# Patient Record
Sex: Female | Born: 1965
Health system: Southern US, Community
[De-identification: ages and names within clinical notes are randomized; demographics above are authoritative.]

## PROBLEM LIST (undated history)

## (undated) DIAGNOSIS — I10 Essential (primary) hypertension: Secondary | ICD-10-CM

## (undated) DIAGNOSIS — F419 Anxiety disorder, unspecified: Secondary | ICD-10-CM

## (undated) DIAGNOSIS — E7800 Pure hypercholesterolemia, unspecified: Secondary | ICD-10-CM

## (undated) DIAGNOSIS — N870 Mild cervical dysplasia: Secondary | ICD-10-CM

## (undated) DIAGNOSIS — T8859XA Other complications of anesthesia, initial encounter: Secondary | ICD-10-CM

## (undated) HISTORY — DX: Pure hypercholesterolemia, unspecified: E78.00

## (undated) HISTORY — PX: BACK SURGERY: SHX140

## (undated) HISTORY — PX: NECK SURGERY: SHX720

## (undated) HISTORY — PX: APPENDECTOMY: SHX54

## (undated) HISTORY — DX: Mild cervical dysplasia: N87.0

## (undated) HISTORY — DX: Essential (primary) hypertension: I10

---

## 1990-05-28 HISTORY — PX: COLPOSCOPY: SHX161

## 1999-03-22 ENCOUNTER — Encounter: Payer: Self-pay | Admitting: Obstetrics and Gynecology

## 1999-03-22 ENCOUNTER — Encounter: Admission: RE | Admit: 1999-03-22 | Discharge: 1999-03-22 | Payer: Self-pay | Admitting: Obstetrics and Gynecology

## 1999-06-08 ENCOUNTER — Other Ambulatory Visit: Admission: RE | Admit: 1999-06-08 | Discharge: 1999-06-08 | Payer: Self-pay | Admitting: Family Medicine

## 2000-01-01 ENCOUNTER — Ambulatory Visit (HOSPITAL_COMMUNITY): Admission: RE | Admit: 2000-01-01 | Discharge: 2000-01-01 | Payer: Self-pay | Admitting: Family Medicine

## 2000-01-01 ENCOUNTER — Encounter: Payer: Self-pay | Admitting: Family Medicine

## 2000-02-23 ENCOUNTER — Encounter: Payer: Self-pay | Admitting: Neurosurgery

## 2000-02-27 ENCOUNTER — Encounter: Payer: Self-pay | Admitting: Neurosurgery

## 2000-02-27 ENCOUNTER — Ambulatory Visit (HOSPITAL_COMMUNITY): Admission: RE | Admit: 2000-02-27 | Discharge: 2000-02-27 | Payer: Self-pay | Admitting: Neurosurgery

## 2000-08-20 ENCOUNTER — Encounter: Payer: Self-pay | Admitting: Family Medicine

## 2000-08-20 ENCOUNTER — Encounter: Admission: RE | Admit: 2000-08-20 | Discharge: 2000-08-20 | Payer: Self-pay | Admitting: Family Medicine

## 2003-01-27 ENCOUNTER — Encounter: Payer: Self-pay | Admitting: Family Medicine

## 2003-01-27 ENCOUNTER — Encounter: Admission: RE | Admit: 2003-01-27 | Discharge: 2003-01-27 | Payer: Self-pay | Admitting: Family Medicine

## 2003-03-23 ENCOUNTER — Encounter: Admission: RE | Admit: 2003-03-23 | Discharge: 2003-03-23 | Payer: Self-pay | Admitting: Family Medicine

## 2003-09-09 ENCOUNTER — Other Ambulatory Visit: Admission: RE | Admit: 2003-09-09 | Discharge: 2003-09-09 | Payer: Self-pay | Admitting: Family Medicine

## 2004-04-27 ENCOUNTER — Encounter: Admission: RE | Admit: 2004-04-27 | Discharge: 2004-04-27 | Payer: Self-pay | Admitting: Obstetrics and Gynecology

## 2004-09-25 ENCOUNTER — Other Ambulatory Visit: Admission: RE | Admit: 2004-09-25 | Discharge: 2004-09-25 | Payer: Self-pay | Admitting: Family Medicine

## 2004-09-26 ENCOUNTER — Encounter: Admission: RE | Admit: 2004-09-26 | Discharge: 2004-12-25 | Payer: Self-pay | Admitting: Family Medicine

## 2005-07-13 ENCOUNTER — Ambulatory Visit: Payer: Self-pay | Admitting: *Deleted

## 2005-08-21 ENCOUNTER — Encounter: Admission: RE | Admit: 2005-08-21 | Discharge: 2005-08-21 | Payer: Self-pay | Admitting: Family Medicine

## 2005-09-11 ENCOUNTER — Encounter: Admission: RE | Admit: 2005-09-11 | Discharge: 2005-09-11 | Payer: Self-pay | Admitting: Family Medicine

## 2005-10-01 ENCOUNTER — Other Ambulatory Visit: Admission: RE | Admit: 2005-10-01 | Discharge: 2005-10-01 | Payer: Self-pay | Admitting: Family Medicine

## 2006-09-13 ENCOUNTER — Encounter: Admission: RE | Admit: 2006-09-13 | Discharge: 2006-09-13 | Payer: Self-pay | Admitting: Family Medicine

## 2006-10-03 ENCOUNTER — Other Ambulatory Visit: Admission: RE | Admit: 2006-10-03 | Discharge: 2006-10-03 | Payer: Self-pay | Admitting: Family Medicine

## 2007-09-16 ENCOUNTER — Encounter: Admission: RE | Admit: 2007-09-16 | Discharge: 2007-09-16 | Payer: Self-pay | Admitting: Gynecology

## 2007-10-24 ENCOUNTER — Other Ambulatory Visit: Admission: RE | Admit: 2007-10-24 | Discharge: 2007-10-24 | Payer: Self-pay | Admitting: Gynecology

## 2008-01-29 ENCOUNTER — Ambulatory Visit: Payer: Self-pay | Admitting: Gynecology

## 2008-05-05 ENCOUNTER — Ambulatory Visit: Payer: Self-pay | Admitting: Gynecology

## 2008-09-15 ENCOUNTER — Ambulatory Visit: Payer: Self-pay | Admitting: Gynecology

## 2008-10-13 ENCOUNTER — Encounter: Admission: RE | Admit: 2008-10-13 | Discharge: 2008-10-13 | Payer: Self-pay | Admitting: Gynecology

## 2008-10-27 ENCOUNTER — Other Ambulatory Visit: Admission: RE | Admit: 2008-10-27 | Discharge: 2008-10-27 | Payer: Self-pay | Admitting: Gynecology

## 2008-10-27 ENCOUNTER — Encounter: Payer: Self-pay | Admitting: Gynecology

## 2008-10-27 ENCOUNTER — Ambulatory Visit: Payer: Self-pay | Admitting: Gynecology

## 2009-11-22 ENCOUNTER — Other Ambulatory Visit: Admission: RE | Admit: 2009-11-22 | Discharge: 2009-11-22 | Payer: Self-pay | Admitting: Gynecology

## 2009-11-22 ENCOUNTER — Ambulatory Visit (HOSPITAL_COMMUNITY): Admission: RE | Admit: 2009-11-22 | Discharge: 2009-11-22 | Payer: Self-pay | Admitting: Gynecology

## 2009-11-22 ENCOUNTER — Ambulatory Visit: Payer: Self-pay | Admitting: Gynecology

## 2010-06-17 ENCOUNTER — Encounter: Payer: Self-pay | Admitting: Obstetrics and Gynecology

## 2010-06-17 ENCOUNTER — Encounter: Payer: Self-pay | Admitting: Interventional Radiology

## 2010-10-13 NOTE — Op Note (Signed)
[facility] [place]  Patient:    Terri Campbell, Terri Campbell                    MRN: [id number] Proc. Date: 02/27/00 Adm. Date:  [id number] Disc. Date: [id number] Attending:  Donn Pierini                           Operative Report  PREOPERATIVE DIAGNOSIS:  Left C6-7 herniated nucleus pulposus with radiculopathy.  POSTOPERATIVE DIAGNOSIS:  Left C6-7 herniated nucleus pulposus with radiculopathy.  OPERATION PERFORMED:  Left C6-7 laminotomy and foraminotomy with microdiskectomy.  SURGEON:  Julio Sicks, M.D.  ASSISTANT:  Reinaldo Meeker, M.D.  ANESTHESIA:  General endotracheal.  INDICATIONS:  [name] is a 45 year old female with history of neck and left upper extremity pain, paresthesias and weakness with a left-sided C7 radiculopathy, which has failed after unsuccessful conservative management. Her MRI scan demonstrated a large left-sided C6-7 foraminal disk herniation causing compression on the left-sided C7 nerve root.  The patient has been counselled as to her options.  She has decided to proceed with a left-sided C6-7 foraminotomy and microdiskectomy for hope of relief of her symptoms.  DESCRIPTION OF PROCEDURE:  The patient was brought to the operating room and placed on the table in the supine position.  After an adequate level of anesthesia was achieved, the patient was positioned prone onto bolsters with her head fixed in a somewhat flexed head position, and held in place with a Mayfield pin headrest. The patients posterior cervical region was prepped and draped sterilely.  A #10 blade was used to make a linear skin incision upon the C6-7 interspace.  A subperiosteal dissection was then performed on the left side exposing the lamina and facet joints of C6 and C7. Intraoperative x-ray was taken.  The level was confirmed.  A laminotomy and foraminotomy were then performed using the high-speed drill and Kerrison rongeurs to remove the inferior  edge of the lamina of C6 and the superior rim of the lamina of C7 as well as the ligamentum flavum between the two.  The facets were undercut.  The C7 nerve root was identified and followed out distal into its foramen.  The microscope was brought into the field and used for microdissection of the left-sided C7 nerve root and underlying disk herniation.  The epineural venous plexus was coagulated and cut.  Micro nerve hooks were then passed beneath the C7 nerve root.  A large amount of free disk herniation was dissected free completely decompressing the nerve root.  All disk material was removed using pituitary microrongeurs.  At this point a very thorough decompression of the left-sided C7 nerve root had been achieved. There was no residual disk herniation.  A blunt probe was passed easily both medially and along the course of the nerve root itself.  The wound was then copiously irrigated with antibiotic solution.  Gelfoam was placed topically for hemostasis and was shown to be good.  Microscope and the retractors were removed.  Hemostasis in the muscle was achieved with electrocautery.  The wound was then closed in layers with Vicryl sutures. Staples were applied to the surface.  There were no apparent complications.  The patient tolerated the procedure well and she returns to the recovery room postoperatively. DD:  02/27/00 TD:  02/27/00 Job: [id number]

## 2010-12-07 ENCOUNTER — Other Ambulatory Visit: Payer: Self-pay | Admitting: Gynecology

## 2010-12-07 DIAGNOSIS — Z1231 Encounter for screening mammogram for malignant neoplasm of breast: Secondary | ICD-10-CM

## 2010-12-22 ENCOUNTER — Other Ambulatory Visit (HOSPITAL_COMMUNITY)
Admission: RE | Admit: 2010-12-22 | Discharge: 2010-12-22 | Disposition: A | Discharge status: Home / Self Care | Payer: Managed Care, Other (non HMO) | Source: Ambulatory Visit | Attending: Gynecology | Admitting: Gynecology

## 2010-12-22 ENCOUNTER — Ambulatory Visit (INDEPENDENT_AMBULATORY_CARE_PROVIDER_SITE_OTHER): Payer: Managed Care, Other (non HMO) | Admitting: Gynecology

## 2010-12-22 ENCOUNTER — Encounter: Payer: Self-pay | Admitting: Gynecology

## 2010-12-22 ENCOUNTER — Ambulatory Visit (HOSPITAL_COMMUNITY)
Admission: RE | Admit: 2010-12-22 | Discharge: 2010-12-22 | Disposition: A | Discharge status: Home / Self Care | Payer: Managed Care, Other (non HMO) | Source: Ambulatory Visit | Attending: Gynecology | Admitting: Gynecology

## 2010-12-22 VITALS — BP 120/74 | Ht 65.5 in | Wt 228.0 lb

## 2010-12-22 DIAGNOSIS — R82998 Other abnormal findings in urine: Secondary | ICD-10-CM

## 2010-12-22 DIAGNOSIS — Z30431 Encounter for routine checking of intrauterine contraceptive device: Secondary | ICD-10-CM

## 2010-12-22 DIAGNOSIS — Z113 Encounter for screening for infections with a predominantly sexual mode of transmission: Secondary | ICD-10-CM

## 2010-12-22 DIAGNOSIS — B373 Candidiasis of vulva and vagina: Secondary | ICD-10-CM

## 2010-12-22 DIAGNOSIS — Z1231 Encounter for screening mammogram for malignant neoplasm of breast: Secondary | ICD-10-CM | POA: Insufficient documentation

## 2010-12-22 DIAGNOSIS — Z01419 Encounter for gynecological examination (general) (routine) without abnormal findings: Secondary | ICD-10-CM

## 2010-12-22 MED ORDER — FLUCONAZOLE 200 MG PO TABS: 200.0000 mg | ORAL_TABLET | Freq: Every day | ORAL | Status: AC

## 2010-12-22 NOTE — Progress Notes (Signed)
Terri Campbell 12-26-1965 [id number]    History:    45 y.o.  for annual exam overall doing well.  Patient does note some spotting after intercourse and wonders whether she has a vaginal infection as she does not normally bleed with the Mirena IUD unless she has an infection. The IUD was placed September 09. She recently saw Dr. Hyacinth Meeker for her full general health exam and followup on her cholesterol and she had done an HIV RPR lipid panel and compensated metabolic panel. She wants also to get STD screening GC and Chlamydia and wants to complete panel serum with hepatitis B and hepatitis C.   Past medical history, past surgical history, family history and social history were all reviewed and documented in the EPIC chart.   ROS:  A 14 point ROS was performed and pertinent positives and negatives are included in the history.  Exam:  Filed Vitals:   12/22/10 1536  BP: 120/74    General appearance:  Normal Head/Neck:  Normal, without cervical or supraclavicular adenopathy. Thyroid:  Symmetrical, normal in size, without palpable masses or nodularity. Respiratory  Effort:  Normal  Auscultation:  Clear without wheezing or rhonchi Cardiovascular  Auscultation:  Regular rate, without rubs, murmurs or gallops  Edema/varicosities:  Not grossly evident Abdominal  Masses/tenderness:  Soft,nontender, without masses, guarding or rebound.  Liver/spleen:  No organomegaly noted  Hernia:  None appreciated    Skin  Inspection:  Grossly normal  Palpation:  Grossly normal Neurologic/psychiatric  Orientation:  Normal with appropriate conversation.  Mood/affect:  Normal  Genitourinary with chaperone present    Breasts: Examined lying and sitting.     Right: Without masses, retractions,discharge or axillary adenopathy.     Left: Without masses, retractions, discharge or axillary adenopathy.   Inguinal/mons:  Normal without inguinal adenopathy  External genitalia:  Normal  BUS/Urethra/Skene's  glands:  Normal   Bladder:  Normal   Vagina:  Normal white discharge noted KOH wet prep done   Cervix:  Normal IUD string visualized GC chlamydia screen done  Uterus:  anteverted, normal in size, shape and contour.  Midline and mobile nontender   Adnexa/parametria:     Rt: Without masses or tenderness    Lt: Without masses or tenderness  Anus and perineum: Normal   Digital rectal exam: Normal sphincter tone without palpated masses or tenderness.   Assessment/Plan:  45 y.o. female for annual exam doing well. Her wet prep is positive for yeast and we will cover with Diflucan 200 mg daily for 5 days and she is prone to yeast infections and hopefully we can eradicate any colonization.  Assuming she does well and does no further postcoital spotting then we'll follow, if symptoms persist or recur then she'll return for further evaluation.  Doing well with her IUD due to be removed or exchanged in September 2014. Self breast exams on a monthly basis discussed and encouraged, she had her mammogram today, assuming normal we'll continue with annual mammography. I ordered a CBC hep B. Hep C. urinalysis GC and Chlamydia screen of the cervix and a urinalysis.  She will followup for the results and assuming normal and she continues well and she will see [place] in a year sooner as needed.   Dara Lords MD, 4:43 PM 12/22/2010

## 2010-12-23 LAB — HEPATITIS C ANTIBODY: HCV Ab: NEGATIVE

## 2010-12-23 LAB — HEPATITIS B SURFACE ANTIGEN: Hepatitis B Surface Ag: NEGATIVE

## 2010-12-25 ENCOUNTER — Telehealth: Payer: Self-pay | Admitting: Gynecology

## 2010-12-25 DIAGNOSIS — N390 Urinary tract infection, site not specified: Secondary | ICD-10-CM

## 2010-12-25 MED ORDER — SULFAMETHOXAZOLE-TRIMETHOPRIM 800-160 MG PO TABS: 1.0000 | ORAL_TABLET | Freq: Two times a day (BID) | ORAL | Status: AC

## 2010-12-25 NOTE — Telephone Encounter (Signed)
Tell patient urinalysis has a small amount of bacteria in it and I want to treat her with Septra DS one by mouth twice a day x3 days.

## 2010-12-25 NOTE — Telephone Encounter (Signed)
PT INFORMED TO TAKE RX AS DIRECTED BELOW.

## 2010-12-25 NOTE — Telephone Encounter (Signed)
LM FOR PT TO CALL ZO:XWRUE MESSAGE.

## 2010-12-27 ENCOUNTER — Other Ambulatory Visit: Payer: Self-pay | Admitting: Gynecology

## 2010-12-27 DIAGNOSIS — R928 Other abnormal and inconclusive findings on diagnostic imaging of breast: Secondary | ICD-10-CM

## 2010-12-28 NOTE — Progress Notes (Signed)
APPOINTMENTS FOR ADDITIONAL VIEWS ON 01/08/11.

## 2011-01-01 ENCOUNTER — Other Ambulatory Visit: Payer: Self-pay | Admitting: *Deleted

## 2011-01-01 DIAGNOSIS — N63 Unspecified lump in unspecified breast: Secondary | ICD-10-CM

## 2011-01-05 ENCOUNTER — Other Ambulatory Visit: Payer: Managed Care, Other (non HMO)

## 2011-01-08 ENCOUNTER — Ambulatory Visit
Admission: RE | Admit: 2011-01-08 | Discharge: 2011-01-08 | Disposition: A | Discharge status: Home / Self Care | Payer: Managed Care, Other (non HMO) | Source: Ambulatory Visit | Attending: Gynecology | Admitting: Gynecology

## 2011-01-08 DIAGNOSIS — R928 Other abnormal and inconclusive findings on diagnostic imaging of breast: Secondary | ICD-10-CM

## 2011-11-23 ENCOUNTER — Other Ambulatory Visit (HOSPITAL_COMMUNITY)
Admission: RE | Admit: 2011-11-23 | Discharge: 2011-11-23 | Disposition: A | Discharge status: Home / Self Care | Payer: 59 | Source: Ambulatory Visit | Attending: Family Medicine | Admitting: Family Medicine

## 2011-11-23 ENCOUNTER — Other Ambulatory Visit: Payer: Self-pay | Admitting: Family Medicine

## 2011-11-23 DIAGNOSIS — Z113 Encounter for screening for infections with a predominantly sexual mode of transmission: Secondary | ICD-10-CM | POA: Insufficient documentation

## 2011-11-23 DIAGNOSIS — Z124 Encounter for screening for malignant neoplasm of cervix: Secondary | ICD-10-CM | POA: Insufficient documentation

## 2011-11-23 DIAGNOSIS — Z1159 Encounter for screening for other viral diseases: Secondary | ICD-10-CM | POA: Insufficient documentation

## 2011-12-24 ENCOUNTER — Encounter: Payer: Managed Care, Other (non HMO) | Admitting: Gynecology

## 2012-06-02 ENCOUNTER — Other Ambulatory Visit (HOSPITAL_COMMUNITY): Payer: Self-pay | Admitting: Family Medicine

## 2012-06-02 DIAGNOSIS — Z1231 Encounter for screening mammogram for malignant neoplasm of breast: Secondary | ICD-10-CM

## 2012-06-16 ENCOUNTER — Ambulatory Visit (HOSPITAL_COMMUNITY)
Admission: RE | Admit: 2012-06-16 | Discharge: 2012-06-16 | Disposition: A | Discharge status: Home / Self Care | Payer: BC Managed Care – PPO | Source: Ambulatory Visit | Attending: Family Medicine | Admitting: Family Medicine

## 2012-06-16 DIAGNOSIS — Z1231 Encounter for screening mammogram for malignant neoplasm of breast: Secondary | ICD-10-CM | POA: Insufficient documentation

## 2013-06-15 ENCOUNTER — Other Ambulatory Visit (HOSPITAL_COMMUNITY): Payer: Self-pay | Admitting: Family Medicine

## 2013-06-15 DIAGNOSIS — Z1231 Encounter for screening mammogram for malignant neoplasm of breast: Secondary | ICD-10-CM

## 2013-06-30 ENCOUNTER — Ambulatory Visit (HOSPITAL_COMMUNITY): Payer: Managed Care, Other (non HMO)

## 2013-06-30 ENCOUNTER — Ambulatory Visit (HOSPITAL_COMMUNITY)
Admission: RE | Admit: 2013-06-30 | Discharge: 2013-06-30 | Disposition: A | Discharge status: Home / Self Care | Payer: BC Managed Care – PPO | Source: Ambulatory Visit | Attending: Family Medicine | Admitting: Family Medicine

## 2013-06-30 DIAGNOSIS — Z1231 Encounter for screening mammogram for malignant neoplasm of breast: Secondary | ICD-10-CM | POA: Insufficient documentation

## 2013-11-11 ENCOUNTER — Ambulatory Visit (HOSPITAL_COMMUNITY)
Admission: EM | Admit: 2013-11-11 | Discharge: 2013-11-12 | Disposition: A | Discharge status: Home / Self Care | Payer: BC Managed Care – PPO | Attending: General Surgery | Admitting: General Surgery

## 2013-11-11 ENCOUNTER — Emergency Department (HOSPITAL_COMMUNITY): Payer: BC Managed Care – PPO

## 2013-11-11 ENCOUNTER — Encounter (HOSPITAL_COMMUNITY)
Admission: EM | Disposition: A | Discharge status: Home / Self Care | Payer: Self-pay | Source: Home / Self Care | Attending: Emergency Medicine

## 2013-11-11 ENCOUNTER — Ambulatory Visit: Admit: 2013-11-11 | Payer: Self-pay | Admitting: Surgery

## 2013-11-11 ENCOUNTER — Encounter (HOSPITAL_COMMUNITY): Payer: BC Managed Care – PPO | Admitting: Anesthesiology

## 2013-11-11 ENCOUNTER — Emergency Department (HOSPITAL_COMMUNITY): Payer: BC Managed Care – PPO | Admitting: Anesthesiology

## 2013-11-11 ENCOUNTER — Encounter (HOSPITAL_COMMUNITY): Payer: Self-pay | Admitting: Emergency Medicine

## 2013-11-11 DIAGNOSIS — I10 Essential (primary) hypertension: Secondary | ICD-10-CM | POA: Insufficient documentation

## 2013-11-11 DIAGNOSIS — Z9049 Acquired absence of other specified parts of digestive tract: Secondary | ICD-10-CM

## 2013-11-11 DIAGNOSIS — K3580 Unspecified acute appendicitis: Secondary | ICD-10-CM

## 2013-11-11 DIAGNOSIS — R1031 Right lower quadrant pain: Secondary | ICD-10-CM

## 2013-11-11 DIAGNOSIS — B373 Candidiasis of vulva and vagina: Secondary | ICD-10-CM | POA: Insufficient documentation

## 2013-11-11 DIAGNOSIS — K37 Unspecified appendicitis: Secondary | ICD-10-CM

## 2013-11-11 DIAGNOSIS — Z79899 Other long term (current) drug therapy: Secondary | ICD-10-CM | POA: Insufficient documentation

## 2013-11-11 DIAGNOSIS — B3731 Acute candidiasis of vulva and vagina: Secondary | ICD-10-CM | POA: Insufficient documentation

## 2013-11-11 HISTORY — PX: LAPAROSCOPIC APPENDECTOMY: SHX408

## 2013-11-11 LAB — CBC WITH DIFFERENTIAL/PLATELET
BASOS ABS: 0 10*3/uL (ref 0.0–0.1)
Basophils Relative: 0 % (ref 0–1)
Eosinophils Absolute: 0 10*3/uL (ref 0.0–0.7)
Eosinophils Relative: 0 % (ref 0–5)
HCT: 46.2 % — ABNORMAL HIGH (ref 36.0–46.0)
Hemoglobin: 16.2 g/dL — ABNORMAL HIGH (ref 12.0–15.0)
LYMPHS ABS: 1.7 10*3/uL (ref 0.7–4.0)
Lymphocytes Relative: 6 % — ABNORMAL LOW (ref 12–46)
MCH: 30.7 pg (ref 26.0–34.0)
MCHC: 35.1 g/dL (ref 30.0–36.0)
MCV: 87.7 fL (ref 78.0–100.0)
MONO ABS: 1.7 10*3/uL — AB (ref 0.1–1.0)
Monocytes Relative: 6 % (ref 3–12)
NEUTROS PCT: 88 % — AB (ref 43–77)
Neutro Abs: 25.6 10*3/uL — ABNORMAL HIGH (ref 1.7–7.7)
PLATELETS: 433 10*3/uL — AB (ref 150–400)
RBC: 5.27 MIL/uL — ABNORMAL HIGH (ref 3.87–5.11)
RDW: 13.6 % (ref 11.5–15.5)
WBC: 29 10*3/uL — ABNORMAL HIGH (ref 4.0–10.5)

## 2013-11-11 LAB — URINALYSIS, ROUTINE W REFLEX MICROSCOPIC
Bilirubin Urine: NEGATIVE
GLUCOSE, UA: NEGATIVE mg/dL
KETONES UR: NEGATIVE mg/dL
Nitrite: NEGATIVE
PROTEIN: 30 mg/dL — AB
Specific Gravity, Urine: 1.028 (ref 1.005–1.030)
Urobilinogen, UA: 0.2 mg/dL (ref 0.0–1.0)
pH: 5.5 (ref 5.0–8.0)

## 2013-11-11 LAB — COMPREHENSIVE METABOLIC PANEL
ALT: 21 U/L (ref 0–35)
AST: 20 U/L (ref 0–37)
Albumin: 4.2 g/dL (ref 3.5–5.2)
Alkaline Phosphatase: 100 U/L (ref 39–117)
BILIRUBIN TOTAL: 0.5 mg/dL (ref 0.3–1.2)
BUN: 18 mg/dL (ref 6–23)
CHLORIDE: 94 meq/L — AB (ref 96–112)
CO2: 24 meq/L (ref 19–32)
CREATININE: 0.93 mg/dL (ref 0.50–1.10)
Calcium: 10.5 mg/dL (ref 8.4–10.5)
GFR calc Af Amer: 82 mL/min — ABNORMAL LOW (ref >90)
GFR, EST NON AFRICAN AMERICAN: 71 mL/min — AB (ref >90)
GLUCOSE: 130 mg/dL — AB (ref 70–99)
Potassium: 4 mEq/L (ref 3.7–5.3)
Sodium: 136 mEq/L — ABNORMAL LOW (ref 137–147)
Total Protein: 9 g/dL — ABNORMAL HIGH (ref 6.0–8.3)

## 2013-11-11 LAB — URINE MICROSCOPIC-ADD ON

## 2013-11-11 LAB — POC URINE PREG, ED: Preg Test, Ur: NEGATIVE

## 2013-11-11 LAB — LIPASE, BLOOD: Lipase: 27 U/L (ref 11–59)

## 2013-11-11 SURGERY — APPENDECTOMY, LAPAROSCOPIC
Anesthesia: General | Site: Abdomen

## 2013-11-11 MED ORDER — LACTATED RINGERS IV SOLN: INTRAVENOUS | Status: DC

## 2013-11-11 MED ORDER — KCL IN DEXTROSE-NACL 20-5-0.45 MEQ/L-%-% IV SOLN
INTRAVENOUS | Status: DC
  Administered 2013-11-11 – 2013-11-12 (×2): via INTRAVENOUS
  Filled 2013-11-11 (×2): qty 1000

## 2013-11-11 MED ORDER — ONDANSETRON HCL 4 MG/2ML IJ SOLN
INTRAMUSCULAR | Status: DC | PRN
  Administered 2013-11-11: 4 mg via INTRAVENOUS

## 2013-11-11 MED ORDER — FENTANYL CITRATE 0.05 MG/ML IJ SOLN
INTRAMUSCULAR | Status: DC | PRN
  Administered 2013-11-11: 50 ug via INTRAVENOUS
  Administered 2013-11-11: 150 ug via INTRAVENOUS
  Administered 2013-11-11: 50 ug via INTRAVENOUS

## 2013-11-11 MED ORDER — HEPARIN SODIUM (PORCINE) 5000 UNIT/ML IJ SOLN
5000.0000 [IU] | Freq: Three times a day (TID) | INTRAMUSCULAR | Status: DC
  Administered 2013-11-12: 5000 [IU] via SUBCUTANEOUS
  Filled 2013-11-11 (×4): qty 1

## 2013-11-11 MED ORDER — ONDANSETRON HCL 4 MG/2ML IJ SOLN
INTRAMUSCULAR | Status: AC
Start: 2013-11-11 — End: 2013-11-11
  Filled 2013-11-11: qty 2

## 2013-11-11 MED ORDER — DEXAMETHASONE SODIUM PHOSPHATE 10 MG/ML IJ SOLN
INTRAMUSCULAR | Status: AC
  Filled 2013-11-11: qty 1

## 2013-11-11 MED ORDER — PROMETHAZINE HCL 25 MG/ML IJ SOLN
6.2500 mg | INTRAMUSCULAR | Status: DC | PRN
Start: 2013-11-11 — End: 2013-11-11

## 2013-11-11 MED ORDER — IOHEXOL 300 MG/ML  SOLN
100.0000 mL | Freq: Once | INTRAMUSCULAR | Status: AC | PRN
  Administered 2013-11-11: 100 mL via INTRAVENOUS

## 2013-11-11 MED ORDER — MIDAZOLAM HCL 5 MG/5ML IJ SOLN
INTRAMUSCULAR | Status: DC | PRN
  Administered 2013-11-11: 2 mg via INTRAVENOUS

## 2013-11-11 MED ORDER — SODIUM CHLORIDE 0.9 % IV SOLN
INTRAVENOUS | Status: DC | PRN
  Administered 2013-11-11 (×2): via INTRAVENOUS

## 2013-11-11 MED ORDER — FENTANYL CITRATE 0.05 MG/ML IJ SOLN: 25.0000 ug | INTRAMUSCULAR | Status: DC | PRN

## 2013-11-11 MED ORDER — PROPOFOL 10 MG/ML IV BOLUS
INTRAVENOUS | Status: DC | PRN
  Administered 2013-11-11: 200 mg via INTRAVENOUS

## 2013-11-11 MED ORDER — GLYCOPYRROLATE 0.2 MG/ML IJ SOLN
INTRAMUSCULAR | Status: DC | PRN
  Administered 2013-11-11: .6 mg via INTRAVENOUS

## 2013-11-11 MED ORDER — MORPHINE SULFATE 2 MG/ML IJ SOLN: 1.0000 mg | INTRAMUSCULAR | Status: DC | PRN

## 2013-11-11 MED ORDER — PROPOFOL 10 MG/ML IV BOLUS
INTRAVENOUS | Status: AC
  Filled 2013-11-11: qty 20

## 2013-11-11 MED ORDER — DEXAMETHASONE SODIUM PHOSPHATE 10 MG/ML IJ SOLN
INTRAMUSCULAR | Status: DC | PRN
  Administered 2013-11-11: 10 mg via INTRAVENOUS

## 2013-11-11 MED ORDER — LIDOCAINE HCL (CARDIAC) 20 MG/ML IV SOLN
INTRAVENOUS | Status: AC
  Filled 2013-11-11: qty 5

## 2013-11-11 MED ORDER — MIDAZOLAM HCL 2 MG/2ML IJ SOLN
INTRAMUSCULAR | Status: AC
  Filled 2013-11-11: qty 2

## 2013-11-11 MED ORDER — KCL IN DEXTROSE-NACL 20-5-0.45 MEQ/L-%-% IV SOLN
INTRAVENOUS | Status: AC
  Filled 2013-11-11: qty 1000

## 2013-11-11 MED ORDER — ONDANSETRON HCL 4 MG/2ML IJ SOLN
4.0000 mg | Freq: Once | INTRAMUSCULAR | Status: AC
  Administered 2013-11-11: 4 mg via INTRAVENOUS
  Filled 2013-11-11: qty 2

## 2013-11-11 MED ORDER — BUPIVACAINE LIPOSOME 1.3 % IJ SUSP
20.0000 mL | Freq: Once | INTRAMUSCULAR | Status: AC
  Administered 2013-11-11: 20 mL
  Filled 2013-11-11: qty 20

## 2013-11-11 MED ORDER — ONDANSETRON HCL 4 MG/2ML IJ SOLN: 4.0000 mg | Freq: Four times a day (QID) | INTRAMUSCULAR | Status: DC | PRN

## 2013-11-11 MED ORDER — CISATRACURIUM BESYLATE (PF) 10 MG/5ML IV SOLN
INTRAVENOUS | Status: DC | PRN
  Administered 2013-11-11: 6 mg via INTRAVENOUS

## 2013-11-11 MED ORDER — KETOROLAC TROMETHAMINE 30 MG/ML IJ SOLN
30.0000 mg | Freq: Once | INTRAMUSCULAR | Status: AC
  Administered 2013-11-11: 30 mg via INTRAVENOUS
  Filled 2013-11-11: qty 1

## 2013-11-11 MED ORDER — ONDANSETRON HCL 4 MG PO TABS: 4.0000 mg | ORAL_TABLET | Freq: Four times a day (QID) | ORAL | Status: DC | PRN

## 2013-11-11 MED ORDER — SODIUM CHLORIDE 0.9 % IV SOLN
1.0000 g | INTRAVENOUS | Status: AC
  Administered 2013-11-11: 1 g via INTRAVENOUS
  Filled 2013-11-11: qty 1

## 2013-11-11 MED ORDER — IOHEXOL 300 MG/ML  SOLN
50.0000 mL | Freq: Once | INTRAMUSCULAR | Status: AC | PRN
  Administered 2013-11-11: 50 mL via ORAL

## 2013-11-11 MED ORDER — ACETAMINOPHEN 325 MG PO TABS
650.0000 mg | ORAL_TABLET | ORAL | Status: DC | PRN
  Administered 2013-11-11 – 2013-11-12 (×3): 650 mg via ORAL
  Filled 2013-11-11 (×3): qty 2

## 2013-11-11 MED ORDER — SUCCINYLCHOLINE CHLORIDE 20 MG/ML IJ SOLN
INTRAMUSCULAR | Status: DC | PRN
  Administered 2013-11-11: 100 mg via INTRAVENOUS

## 2013-11-11 MED ORDER — LIDOCAINE HCL (CARDIAC) 20 MG/ML IV SOLN
INTRAVENOUS | Status: DC | PRN
  Administered 2013-11-11: 100 mg via INTRAVENOUS

## 2013-11-11 MED ORDER — OXYCODONE-ACETAMINOPHEN 5-325 MG PO TABS: 1.0000 | ORAL_TABLET | ORAL | Status: DC | PRN

## 2013-11-11 MED ORDER — MEPERIDINE HCL 50 MG/ML IJ SOLN: 6.2500 mg | INTRAMUSCULAR | Status: DC | PRN

## 2013-11-11 MED ORDER — NEOSTIGMINE METHYLSULFATE 10 MG/10ML IV SOLN
INTRAVENOUS | Status: DC | PRN
  Administered 2013-11-11: 4 mg via INTRAVENOUS

## 2013-11-11 MED ORDER — FENTANYL CITRATE 0.05 MG/ML IJ SOLN
INTRAMUSCULAR | Status: AC
  Filled 2013-11-11: qty 5

## 2013-11-11 MED ORDER — CEFOXITIN SODIUM 2 G IV SOLR
2.0000 g | Freq: Once | INTRAVENOUS | Status: AC
  Administered 2013-11-11: 2 g via INTRAVENOUS
  Filled 2013-11-11: qty 2

## 2013-11-11 SURGICAL SUPPLY — 34 items
APPLIER CLIP ROT 10 11.4 M/L (STAPLE)
CABLE HIGH FREQUENCY MONO STRZ (ELECTRODE) IMPLANT
CLIP APPLIE ROT 10 11.4 M/L (STAPLE) IMPLANT
CUTTER FLEX LINEAR 45M (STAPLE) ×2 IMPLANT
DECANTER SPIKE VIAL GLASS SM (MISCELLANEOUS) ×2 IMPLANT
DRAPE LAPAROSCOPIC ABDOMINAL (DRAPES) ×2 IMPLANT
ELECT REM PT RETURN 9FT ADLT (ELECTROSURGICAL) ×2
ELECTRODE REM PT RTRN 9FT ADLT (ELECTROSURGICAL) ×1 IMPLANT
ENDOLOOP SUT PDS II  0 18 (SUTURE)
ENDOLOOP SUT PDS II 0 18 (SUTURE) IMPLANT
GLOVE BIOGEL M 8.0 STRL (GLOVE) ×2 IMPLANT
GOWN SPEC L4 XLG W/TWL (GOWN DISPOSABLE) ×2 IMPLANT
GOWN STRL REUS W/TWL XL LVL3 (GOWN DISPOSABLE) ×6 IMPLANT
KIT BASIN OR (CUSTOM PROCEDURE TRAY) ×2 IMPLANT
POUCH RETRIEVAL ECOSAC 10 (ENDOMECHANICALS) ×1 IMPLANT
POUCH RETRIEVAL ECOSAC 10MM (ENDOMECHANICALS) ×1
POUCH SPECIMEN RETRIEVAL 10MM (ENDOMECHANICALS) IMPLANT
RELOAD 45 VASCULAR/THIN (ENDOMECHANICALS) ×2 IMPLANT
RELOAD STAPLE TA45 3.5 REG BLU (ENDOMECHANICALS) IMPLANT
SCISSORS LAP 5X45 EPIX DISP (ENDOMECHANICALS) IMPLANT
SET IRRIG TUBING LAPAROSCOPIC (IRRIGATION / IRRIGATOR) ×2 IMPLANT
SHEARS CURVED HARMONIC AC 45CM (MISCELLANEOUS) ×2 IMPLANT
SLEEVE XCEL OPT CAN 5 100 (ENDOMECHANICALS) ×2 IMPLANT
SOLUTION ANTI FOG 6CC (MISCELLANEOUS) ×2 IMPLANT
STRIP CLOSURE SKIN 1/2X4 (GAUZE/BANDAGES/DRESSINGS) IMPLANT
SUT VIC AB 4-0 SH 18 (SUTURE) ×2 IMPLANT
TOWEL OR 17X26 10 PK STRL BLUE (TOWEL DISPOSABLE) ×2 IMPLANT
TOWEL OR NON WOVEN STRL DISP B (DISPOSABLE) ×2 IMPLANT
TRAY FOLEY CATH 14FRSI W/METER (CATHETERS) ×2 IMPLANT
TRAY LAP CHOLE (CUSTOM PROCEDURE TRAY) ×2 IMPLANT
TROCAR BLADELESS OPT 5 100 (ENDOMECHANICALS) ×2 IMPLANT
TROCAR XCEL BLUNT TIP 100MML (ENDOMECHANICALS) ×2 IMPLANT
TROCAR XCEL NON-BLD 11X100MML (ENDOMECHANICALS) IMPLANT
TUBING INSUFFLATION 10FT LAP (TUBING) ×2 IMPLANT

## 2013-11-11 NOTE — ED Provider Notes (Signed)
CSN: [id number]     Arrival date & time 11/11/13  [id number] History   First MD Initiated Contact with Patient 11/11/13 1040     Chief Complaint  Patient presents with  . Abdominal Pain     (Consider location/radiation/quality/duration/timing/severity/associated sxs/prior Treatment) Patient is a 48 y.o. female presenting with abdominal pain. The history is provided by the patient.  Abdominal Pain Pain location:  RLQ Pain quality: aching   Pain radiates to:  Does not radiate Pain severity:  Moderate Onset quality:  Gradual Timing:  Constant Progression:  Worsening Chronicity:  New Context: not recent illness and not sick contacts   Relieved by:  Nothing Worsened by:  Nothing tried Associated symptoms: anorexia, belching, chills, nausea and vomiting   Associated symptoms: no cough and no shortness of breath     Past Medical History  Diagnosis Date  . Hypertension   . Elevated cholesterol   . CIN I (cervical intraepithelial neoplasia I)    Past Surgical History  Procedure Laterality Date  . Neck surgery      disc  . Colposcopy  1992   Family History  Problem Relation Age of Onset  . Hypertension Mother   . Breast cancer Maternal Grandmother    History  Substance Use Topics  . Smoking status: Never Smoker   . Smokeless tobacco: Never Used  . Alcohol Use: 0.0 oz/week    .5 drink(s) per week   OB History   Grav Para Term Preterm Abortions TAB SAB Ect Mult Living   0              Review of Systems  Constitutional: Positive for chills.  Respiratory: Negative for cough and shortness of breath.   Gastrointestinal: Positive for nausea, vomiting, abdominal pain and anorexia.  All other systems reviewed and are negative.     Allergies  Dipth, acell pertus,; Nitrofurantoin monohyd macro; and Vicodin  Home Medications   Prior to Admission medications   Medication Sig Start Date End Date Taking? Authorizing Provider  azelastine (ASTELIN) 0.1 % nasal spray Place 1  spray into both nostrils 2 (two) times daily as needed for rhinitis or allergies. Use in each nostril as directed   Yes Historical Provider, MD  Cholecalciferol (VITAMIN D PO) Take by mouth.     Yes Historical Provider, MD  CRANBERRY PO Take 300 mg by mouth every morning.   Yes Historical Provider, MD  diphenhydrAMINE (BENADRYL) 25 mg capsule Take 25 mg by mouth at bedtime.   Yes Historical Provider, MD  fish oil-omega-3 fatty acids 1000 MG capsule Take 1 g by mouth 2 (two) times daily.    Yes Historical Provider, MD  levonorgestrel (MIRENA) 20 MCG/24HR IUD 1 each by Intrauterine route once. INSERTED 0/3/09    Yes Historical Provider, MD  lisinopril-hydrochlorothiazide (PRINZIDE,ZESTORETIC) 20-12.5 MG per tablet Take 1 tablet by mouth daily.     Yes Historical Provider, MD  Lutein 20 MG CAPS Take 20 mg by mouth every morning.   Yes Historical Provider, MD  Multiple Vitamin (MULTIVITAMIN) capsule Take 1 capsule by mouth daily.     Yes Historical Provider, MD  pravastatin (PRAVACHOL) 40 MG tablet Take 40 mg by mouth daily.     Yes Historical Provider, MD  Probiotic Product (PROBIOTIC PO) Take 1 capsule by mouth every morning.   Yes Historical Provider, MD  pseudoephedrine (SUDAFED) 60 MG tablet Take 120 mg by mouth daily.   Yes Historical Provider, MD  vitamin C (ASCORBIC ACID) 250 MG tablet  Take 250 mg by mouth every morning.   Yes Historical Provider, MD   BP 134/78  Pulse 107  Temp(Src) 99.5 F (37.5 C) (Oral)  Resp 16  SpO2 100% Physical Exam  Nursing note and vitals reviewed. Constitutional: She is oriented to person, place, and time. She appears well-developed and well-nourished. No distress.  HENT:  Head: Normocephalic and atraumatic.  Mouth/Throat: Mucous membranes are dry.  Eyes: EOM are normal. Pupils are equal, round, and reactive to light.  Neck: Normal range of motion. Neck supple.  Cardiovascular: Normal rate and regular rhythm.  Exam reveals no friction rub.   No murmur  heard. Pulmonary/Chest: Effort normal and breath sounds normal. No respiratory distress. She has no wheezes. She has no rales.  Abdominal: Soft. She exhibits no distension. There is no tenderness. There is no rebound.  Musculoskeletal: Normal range of motion. She exhibits no edema.  Neurological: She is alert and oriented to person, place, and time.  Skin: She is not diaphoretic.    ED Course  Procedures (including critical care time) Labs Review Labs Reviewed  CBC WITH DIFFERENTIAL  COMPREHENSIVE METABOLIC PANEL  LIPASE, BLOOD  URINALYSIS, ROUTINE W REFLEX MICROSCOPIC  POC URINE PREG, ED    Imaging Review Ct Abdomen Pelvis W Contrast  11/11/2013   CLINICAL DATA:  Right lower quadrant pain, possible appendicitis  EXAM: CT ABDOMEN AND PELVIS WITH CONTRAST  TECHNIQUE: Multidetector CT imaging of the abdomen and pelvis was performed using the standard protocol following bolus administration of intravenous contrast.  CONTRAST:  50mL OMNIPAQUE IOHEXOL 300 MG/ML SOLN, OMNIPAQUE IOHEXOL 300 MG/ML SOLN  COMPARISON:  None.  FINDINGS: Lung bases are unremarkable. Sagittal images of the spine shows mild degenerative changes lower thoracic and upper lumbar spine.  Liver, spleen, pancreas and adrenal glands are unremarkable. No calcified gallstones are noted within gallbladder.  No aortic aneurysm. Kidneys are symmetrical in size and enhancement. No hydronephrosis or hydroureter.  Delayed renal images shows bilateral renal symmetrical excretion. Bilateral visualized proximal ureter is unremarkable.  Small nonspecific mesenteric lymph nodes are noted. No small bowel obstruction. No free abdominal air. No abdominal ascites. There is abnormal thickening and enhancement of the appendix and stranding of surrounding fat. The appendix measures 1.2 cm in diameter. There is also thickening of cecal wall surrounding the base of the appendix. Findings are consistent with acute appendicitis. Trace periappendiceal  fluid is noted. No definite evidence of perforation or extraluminal contrast.  IUD noted within anteflexed uterus. Trace free fluid noted within posterior cul-de-sac. Few diverticula are noted sigmoid colon. No evidence of acute diverticulitis. No inguinal adenopathy. No destructive bony lesions are noted within pelvis.  IMPRESSION: 1. There is abnormal thickening and enhancement of the appendix. There is thickening of cecal wall surrounding the appendix base. Findings are consistent with acute appendicitis. 2. No definite evidence of perforation or extraluminal contrast material. 3. Few sigmoid colon diverticula. No evidence of acute diverticulitis. 4. IUD within anteflexed uterus. 5. No hydronephrosis or hydroureter. These results were called by telephone at the time of interpretation on 11/11/2013 at 1:16 PM to Dr. Marena Chancy , who verbally acknowledged these results.   Electronically Signed   By: Natasha Mead M.D.   On: 11/11/2013 13:16     EKG Interpretation None      MDM   Final diagnoses:  Appendicitis    12F here with RLQ pain - sent for r/o appendcitis. Abdominal pain, nausea, vomiting began last night. No diarrhea. No fevers. [facility]. On exam, tachycardia, no fever, normotensive. RLQ pain on exam.  CT shows appendicitis. Surgery taking to the OR.  Dagmar HaitWilliam Blair Walden, MD 11/11/13 [phone]

## 2013-11-11 NOTE — ED Notes (Signed)
Pt c/o RLQ pain with vomiting since last night.  Went to PCP and was sent here for possible appendicitis.

## 2013-11-11 NOTE — Transfer of Care (Signed)
Immediate Anesthesia Transfer of Care Note  Patient: Terri Campbell  Procedure(s) Performed: Procedure(s): APPENDECTOMY LAPAROSCOPIC (N/A)  Patient Location: PACU  Anesthesia Type:General  Level of Consciousness: sedated  Airway & Oxygen Therapy: Patient Spontanous Breathing and Patient connected to face mask oxygen  Post-op Assessment: Report given to PACU RN and Post -op Vital signs reviewed and stable  Post vital signs: Reviewed and stable  Complications: No apparent anesthesia complications

## 2013-11-11 NOTE — Anesthesia Preprocedure Evaluation (Signed)
Anesthesia Evaluation  Patient identified by MRN, date of birth, ID band Patient awake    Reviewed: Allergy & Precautions, H&P , NPO status , Patient's Chart, lab work & pertinent test results  Airway Mallampati: II TM Distance: >3 FB Neck ROM: Full    Dental no notable dental hx.    Pulmonary neg pulmonary ROS,  breath sounds clear to auscultation  Pulmonary exam normal       Cardiovascular hypertension, Pt. on medications negative cardio ROS  Rhythm:Regular Rate:Normal     Neuro/Psych negative neurological ROS  negative psych ROS   GI/Hepatic negative GI ROS, Neg liver ROS,   Endo/Other  negative endocrine ROS  Renal/GU negative Renal ROS  negative genitourinary   Musculoskeletal negative musculoskeletal ROS (+)   Abdominal   Peds negative pediatric ROS (+)  Hematology negative hematology ROS (+)   Anesthesia Other Findings   Reproductive/Obstetrics negative OB ROS                           Anesthesia Physical Anesthesia Plan  ASA: II and emergent  Anesthesia Plan: General   Post-op Pain Management:    Induction: Intravenous  Airway Management Planned: Oral ETT  Additional Equipment:   Intra-op Plan:   Post-operative Plan: Extubation in OR  Informed Consent: I have reviewed the patients History and Physical, chart, labs and discussed the procedure including the risks, benefits and alternatives for the proposed anesthesia with the patient or authorized representative who has indicated his/her understanding and acceptance.   Dental advisory given  Plan Discussed with: CRNA  Anesthesia Plan Comments:         Anesthesia Quick Evaluation  

## 2013-11-11 NOTE — H&P (Signed)
Chief Complaint:  Right lower quadrant pain since last night  History of Present Illness:  Terri Campbell is an 48 y.o. female who presented to the ER today with right lower quadrant pain and on CT has appendicitis.  Informed consent was obtained by me in the ER including lap and open appendectomy.    Past Medical History  Diagnosis Date  . Hypertension   . Elevated cholesterol   . CIN I (cervical intraepithelial neoplasia I)     Past Surgical History  Procedure Laterality Date  . Neck surgery      disc  . Colposcopy  1992    Current Facility-Administered Medications  Medication Dose Route Frequency Provider Last Rate Last Dose  . cefOXitin (MEFOXIN) 2 g in dextrose 5 % 50 mL IVPB  2 g Intravenous Once Osvaldo Shipper, MD       Current Outpatient Prescriptions  Medication Sig Dispense Refill  . azelastine (ASTELIN) 0.1 % nasal spray Place 1 spray into both nostrils 2 (two) times daily as needed for rhinitis or allergies. Use in each nostril as directed      . Cholecalciferol (VITAMIN D PO) Take by mouth.        . CRANBERRY PO Take 300 mg by mouth every morning.      . diphenhydrAMINE (BENADRYL) 25 mg capsule Take 25 mg by mouth at bedtime.      . fish oil-omega-3 fatty acids 1000 MG capsule Take 1 g by mouth 2 (two) times daily.       Marland Kitchen levonorgestrel (MIRENA) 20 MCG/24HR IUD 1 each by Intrauterine route once. INSERTED 0/3/09       . lisinopril-hydrochlorothiazide (PRINZIDE,ZESTORETIC) 20-12.5 MG per tablet Take 1 tablet by mouth daily.        . Lutein 20 MG CAPS Take 20 mg by mouth every morning.      . Multiple Vitamin (MULTIVITAMIN) capsule Take 1 capsule by mouth daily.        . pravastatin (PRAVACHOL) 40 MG tablet Take 40 mg by mouth daily.        . Probiotic Product (PROBIOTIC PO) Take 1 capsule by mouth every morning.      . pseudoephedrine (SUDAFED) 60 MG tablet Take 120 mg by mouth daily.      . vitamin C (ASCORBIC ACID) 250 MG tablet Take 250 mg by mouth every  morning.       Dipth, acell pertus,; Nitrofurantoin monohyd macro; and Vicodin Family History  Problem Relation Age of Onset  . Hypertension Mother   . Breast cancer Maternal Grandmother    Social History:   reports that she has never smoked. She has never used smokeless tobacco. She reports that she drinks alcohol. She reports that she does not use illicit drugs.   REVIEW OF SYSTEMS : Positive for cervical neck surgery but not other surgery;  No history of DVT ; otherwise negative  Physical Exam:   Blood pressure 106/54, pulse 102, temperature 99.5 F (37.5 C), temperature source Oral, resp. rate 18, SpO2 100.00%. There is no weight on file to calculate BMI.  Gen:  WDWN WF NAD  Neurological: Alert and oriented to person, place, and time. Motor and sensory function is grossly intact  Head: Normocephalic and atraumatic.  Eyes: Conjunctivae are normal. Pupils are equal, round, and reactive to light. No scleral icterus.  Neck: Normal range of motion. Neck supple. No tracheal deviation or thyromegaly present.  Cardiovascular:  SR without murmurs or gallops.  No carotid bruits  Breast:  Not examined Respiratory: Effort normal.  No respiratory distress. No chest wall tenderness. Breath sounds normal.  No wheezes, rales or rhonchi.  Abdomen:  Right lower quadrant pain GU:  negative Musculoskeletal: Normal range of motion. Extremities are nontender. No cyanosis, edema or clubbing noted Lymphadenopathy: No cervical, preauricular, postauricular or axillary adenopathy is present Skin: Skin is warm and dry. No rash noted. No diaphoresis. No erythema. No pallor. Pscyh: Normal mood and affect. Behavior is normal. Judgment and thought content normal.   LABORATORY RESULTS: Results for orders placed during the hospital encounter of 11/11/13 (from the past 48 hour(s))  URINALYSIS, ROUTINE W REFLEX MICROSCOPIC     Status: Abnormal   Collection Time    11/11/13 11:08 AM      Result Value Ref Range    Color, Urine AMBER (*) YELLOW   Comment: BIOCHEMICALS MAY BE AFFECTED BY COLOR   APPearance TURBID (*) CLEAR   Specific Gravity, Urine 1.028  1.005 - 1.030   pH 5.5  5.0 - 8.0   Glucose, UA NEGATIVE  NEGATIVE mg/dL   Hgb urine dipstick SMALL (*) NEGATIVE   Bilirubin Urine NEGATIVE  NEGATIVE   Ketones, ur NEGATIVE  NEGATIVE mg/dL   Protein, ur 30 (*) NEGATIVE mg/dL   Urobilinogen, UA 0.2  0.0 - 1.0 mg/dL   Nitrite NEGATIVE  NEGATIVE   Leukocytes, UA SMALL (*) NEGATIVE  URINE MICROSCOPIC-ADD ON     Status: Abnormal   Collection Time    11/11/13 11:08 AM      Result Value Ref Range   WBC, UA 0-2  <3 WBC/hpf   RBC / HPF 3-6  <3 RBC/hpf   Bacteria, UA MANY (*) RARE   Urine-Other AMORPHOUS URATES/PHOSPHATES    POC URINE PREG, ED     Status: None   Collection Time    11/11/13 11:17 AM      Result Value Ref Range   Preg Test, Ur NEGATIVE  NEGATIVE   Comment:            THE SENSITIVITY OF THIS     METHODOLOGY IS >24 mIU/mL  CBC WITH DIFFERENTIAL     Status: Abnormal   Collection Time    11/11/13 11:56 AM      Result Value Ref Range   WBC 29.0 (*) 4.0 - 10.5 K/uL   RBC 5.27 (*) 3.87 - 5.11 MIL/uL   Hemoglobin 16.2 (*) 12.0 - 15.0 g/dL   HCT 46.2 (*) 36.0 - 46.0 %   MCV 87.7  78.0 - 100.0 fL   MCH 30.7  26.0 - 34.0 pg   MCHC 35.1  30.0 - 36.0 g/dL   RDW 13.6  11.5 - 15.5 %   Platelets 433 (*) 150 - 400 K/uL   Neutrophils Relative % 88 (*) 43 - 77 %   Lymphocytes Relative 6 (*) 12 - 46 %   Monocytes Relative 6  3 - 12 %   Eosinophils Relative 0  0 - 5 %   Basophils Relative 0  0 - 1 %   Neutro Abs 25.6 (*) 1.7 - 7.7 K/uL   Lymphs Abs 1.7  0.7 - 4.0 K/uL   Monocytes Absolute 1.7 (*) 0.1 - 1.0 K/uL   Eosinophils Absolute 0.0  0.0 - 0.7 K/uL   Basophils Absolute 0.0  0.0 - 0.1 K/uL  COMPREHENSIVE METABOLIC PANEL     Status: Abnormal   Collection Time    11/11/13 11:56 AM      Result  Value Ref Range   Sodium 136 (*) 137 - 147 mEq/L   Potassium 4.0  3.7 - 5.3 mEq/L    Chloride 94 (*) 96 - 112 mEq/L   CO2 24  19 - 32 mEq/L   Glucose, Bld 130 (*) 70 - 99 mg/dL   BUN 18  6 - 23 mg/dL   Creatinine, Ser 0.93  0.50 - 1.10 mg/dL   Calcium 10.5  8.4 - 10.5 mg/dL   Total Protein 9.0 (*) 6.0 - 8.3 g/dL   Albumin 4.2  3.5 - 5.2 g/dL   AST 20  0 - 37 U/L   ALT 21  0 - 35 U/L   Alkaline Phosphatase 100  39 - 117 U/L   Total Bilirubin 0.5  0.3 - 1.2 mg/dL   GFR calc non Af Amer 71 (*) >90 mL/min   GFR calc Af Amer 82 (*) >90 mL/min   Comment: (NOTE)     The eGFR has been calculated using the CKD EPI equation.     This calculation has not been validated in all clinical situations.     eGFR's persistently <90 mL/min signify possible Chronic Kidney     Disease.  LIPASE, BLOOD     Status: None   Collection Time    11/11/13 11:56 AM      Result Value Ref Range   Lipase 27  11 - 59 U/L     RADIOLOGY RESULTS: Ct Abdomen Pelvis W Contrast  11/11/2013   CLINICAL DATA:  Right lower quadrant pain, possible appendicitis  EXAM: CT ABDOMEN AND PELVIS WITH CONTRAST  TECHNIQUE: Multidetector CT imaging of the abdomen and pelvis was performed using the standard protocol following bolus administration of intravenous contrast.  CONTRAST:  42m OMNIPAQUE IOHEXOL 300 MG/ML SOLN, 1082mOMNIPAQUE IOHEXOL 300 MG/ML SOLN  COMPARISON:  None.  FINDINGS: Lung bases are unremarkable. Sagittal images of the spine shows mild degenerative changes lower thoracic and upper lumbar spine.  Liver, spleen, pancreas and adrenal glands are unremarkable. No calcified gallstones are noted within gallbladder.  No aortic aneurysm. Kidneys are symmetrical in size and enhancement. No hydronephrosis or hydroureter.  Delayed renal images shows bilateral renal symmetrical excretion. Bilateral visualized proximal ureter is unremarkable.  Small nonspecific mesenteric lymph nodes are noted. No small bowel obstruction. No free abdominal air. No abdominal ascites. There is abnormal thickening and enhancement of the  appendix and stranding of surrounding fat. The appendix measures 1.2 cm in diameter. There is also thickening of cecal wall surrounding the base of the appendix. Findings are consistent with acute appendicitis. Trace periappendiceal fluid is noted. No definite evidence of perforation or extraluminal contrast.  IUD noted within anteflexed uterus. Trace free fluid noted within posterior cul-de-sac. Few diverticula are noted sigmoid colon. No evidence of acute diverticulitis. No inguinal adenopathy. No destructive bony lesions are noted within pelvis.  IMPRESSION: 1. There is abnormal thickening and enhancement of the appendix. There is thickening of cecal wall surrounding the appendix base. Findings are consistent with acute appendicitis. 2. No definite evidence of perforation or extraluminal contrast material. 3. Few sigmoid colon diverticula. No evidence of acute diverticulitis. 4. IUD within anteflexed uterus. 5. No hydronephrosis or hydroureter. These results were called by telephone at the time of interpretation on 11/11/2013 at 1:16 PM to Dr. WIMurlean Caller who verbally acknowledged these results.   Electronically Signed   By: LiLahoma Crocker.D.   On: 11/11/2013 13:16    Problem List: Patient Active Problem List  Diagnosis Date Noted  . Acute appendicitis 11/11/2013    Assessment & Plan: Acute appendicitis Lap appendectomy    Matt B. Hassell Done, MD, [facility] [phone] beeper [phone]  11/11/2013 2:12 PM

## 2013-11-11 NOTE — Anesthesia Procedure Notes (Signed)
Procedure Name: Intubation Date/Time: 11/11/2013 2:55 PM Performed by: Leroy LibmanEARDON, DIANA L Patient Re-evaluated:Patient Re-evaluated prior to inductionOxygen Delivery Method: Circle system utilized Preoxygenation: Pre-oxygenation with 100% oxygen Intubation Type: IV induction, Rapid sequence and Cricoid Pressure applied Laryngoscope Size: Miller and 2 Grade View: Grade I Tube type: Oral Tube size: 7.5 mm Number of attempts: 1 Airway Equipment and Method: Stylet Placement Confirmation: ETT inserted through vocal cords under direct vision,  breath sounds checked- equal and bilateral and positive ETCO2 Secured at: 22 cm Tube secured with: Tape Dental Injury: Teeth and Oropharynx as per pre-operative assessment

## 2013-11-11 NOTE — Op Note (Signed)
Surgeon: Wenda LowMatt Martin, MD, FACS  Asst:  none  Anes:  general  Procedure: Laparoscopic appendectomy  Diagnosis: Acute appendicitis  Complications: none  EBL:   15 cc  Drains: none  Description of Procedure:  [facility]  After anesthesia was administered and the patient was prepped a timeout was performed.  The abdomen was entered using Hassan technique through the umbilicus.  Two 5 mm trocars were place in the LLQ and RUQ.  The purulent appendix was fixed to the lateral abdominal wall and the ileum and fat pad walled it off.  The base of the appendix was isolated and the mesentery was transected with the Harmonic scalpel.  The appendix was removed by applying the 4.5 mm vascular stapler to the base and firing it to leave an intact staple line.  The appendix was placed in a bag and removed.  The defect was closed with a figure of 8 of 0 Vicryl and inspected laparoscopically.  The port sites were injected with Exparel and closed with 4-0 Vicryl and Dermabond.    The patient tolerated the procedure well and was taken to the PACU in stable condition.     Matt B. Daphine DeutscherMartin, MD, [facility] [place] [phone]

## 2013-11-11 NOTE — Anesthesia Postprocedure Evaluation (Signed)
  Anesthesia Post-op Note  Patient: Terri Campbell  Procedure(s) Performed: Procedure(s) (LRB): APPENDECTOMY LAPAROSCOPIC (N/A)  Patient Location: PACU  Anesthesia Type: General  Level of Consciousness: awake and alert   Airway and Oxygen Therapy: Patient Spontanous Breathing  Post-op Pain: mild  Post-op Assessment: Post-op Vital signs reviewed, Patient's Cardiovascular Status Stable, Respiratory Function Stable, Patent Airway and No signs of Nausea or vomiting  Last Vitals:  Filed Vitals:   11/11/13 1700  BP: 118/64  Pulse: 85  Temp: 37.2 C  Resp: 27    Post-op Vital Signs: stable   Complications: No apparent anesthesia complications

## 2013-11-11 NOTE — ED Notes (Signed)
Terri Campbell attempted to blood draws and was unsuccessful. Nurse is made aware.

## 2013-11-12 ENCOUNTER — Encounter (HOSPITAL_COMMUNITY): Payer: Self-pay | Admitting: Surgery

## 2013-11-12 LAB — CBC
HCT: 41.3 % (ref 36.0–46.0)
Hemoglobin: 14.1 g/dL (ref 12.0–15.0)
MCH: 30.2 pg (ref 26.0–34.0)
MCHC: 34.1 g/dL (ref 30.0–36.0)
MCV: 88.4 fL (ref 78.0–100.0)
PLATELETS: 345 10*3/uL (ref 150–400)
RBC: 4.67 MIL/uL (ref 3.87–5.11)
RDW: 13.6 % (ref 11.5–15.5)
WBC: 26.2 10*3/uL — ABNORMAL HIGH (ref 4.0–10.5)

## 2013-11-12 MED ORDER — FLUCONAZOLE 100 MG PO TABS: 100.0000 mg | ORAL_TABLET | Freq: Every day | ORAL | Status: DC

## 2013-11-12 NOTE — Progress Notes (Signed)
Utilization review completed.  

## 2013-11-12 NOTE — Discharge Instructions (Signed)
[facility] ______[facility] °LAPAROSCOPIC SURGERY: POST OP INSTRUCTIONS °Always review your discharge instruction sheet given to you by the facility where your surgery was performed. °IF YOU HAVE DISABILITY OR FAMILY LEAVE FORMS, YOU MUST BRING THEM TO THE OFFICE FOR PROCESSING.   °DO NOT GIVE THEM TO YOUR DOCTOR. ° °1. A prescription for pain medication may be given to you upon discharge.  Take your pain medication as prescribed, if needed.  If narcotic pain medicine is not needed, then you may take acetaminophen (Tylenol) or ibuprofen (Advil) as needed. °2. Take your usually prescribed medications unless otherwise directed. °3. If you need a refill on your pain medication, please contact your pharmacy.  They will contact our office to request authorization. Prescriptions will not be filled after 5pm or on week-ends. °4. You should follow a light diet the first few days after arrival home, such as soup and crackers, etc.  Be sure to include lots of fluids daily. °5. Most patients will experience some swelling and bruising in the area of the incisions.  Ice packs will help.  Swelling and bruising can take several days to resolve.  °6. It is common to experience some constipation if taking pain medication after surgery.  Increasing fluid intake and taking a stool softener (such as Colace) will usually help or prevent this problem from occurring.  A mild laxative (Milk of Magnesia or Miralax) should be taken according to package instructions if there are no bowel movements after 48 hours. °7. Unless discharge instructions indicate otherwise, you may remove your bandages 24-48 hours after surgery, and you may shower at that time.  You may have steri-strips (small skin tapes) in place directly over the incision.  These strips should be left on the skin for 7-10 days.  If your surgeon used skin glue on the incision, you may shower in 24 hours.  The glue will flake off over the next 2-3 weeks.  Any sutures or  staples will be removed at the office during your follow-up visit. °8. ACTIVITIES:  You may resume regular (light) daily activities beginning the next day--such as daily self-care, walking, climbing stairs--gradually increasing activities as tolerated.  You may have sexual intercourse when it is comfortable.  Refrain from any heavy lifting or straining until approved by your doctor. °a. You may drive when you are no longer taking prescription pain medication, you can comfortably wear a seatbelt, and you can safely maneuver your car and apply brakes. °b. RETURN TO WORK:  __________________________________________________________ °9. You should see your doctor in the office for a follow-up appointment approximately 2-3 weeks after your surgery.  Make sure that you call for this appointment within a day or two after you arrive home to insure a convenient appointment time. °10. OTHER INSTRUCTIONS: __________________________________________________________________________________________________________________________ __________________________________________________________________________________________________________________________ °WHEN TO CALL YOUR DOCTOR: °1. Fever over 101.0 °2. Inability to urinate °3. Continued bleeding from incision. °4. Increased pain, redness, or drainage from the incision. °5. Increasing abdominal pain ° °The clinic staff is available to answer your questions during regular business hours.  Please don’t hesitate to call and ask to speak to one of the nurses for clinical concerns.  If you have a medical emergency, go to the nearest emergency room or call 911.  A surgeon from [facility] is always on call at the hospital. °[address], [address], [place], [place]  [address] ? [address] [address], [place], [place]   [address] °[phone] ? [phone] ? FAX [phone] °Web site:   [email or web address] °

## 2013-11-12 NOTE — Discharge Summary (Signed)
Patient ID: Terri KindlerMarrene Campbell MRN: [id number] DOB/AGE: [birth year] 48 y.o.  Admit date: 11/11/2013 Discharge date: 11/12/2013  Procedures: lap appy  Consults: None  Reason for Admission: Terri KindlerMarrene Tourigny is an 48 y.o. female who presented to the ER today with right lower quadrant pain and on CT has appendicitis. Informed consent was obtained by me in the ER including lap and open appendectomy.   Admission Diagnoses:  1. Acute appendicitis  Hospital Course: The patient was admitted and taken to the OR for a lap appy.  She tolerated this procedure well.  She was doing well on POD 1, tolerating solid food and tylenol for pain.  She did complain of a vaginal yeast infection and diflucan was given.  She was otherwise stable for dc home.  PE: Abd: soft, appropriately tender, incisions c/d/i, +BS, obese  Discharge Diagnoses:  Active Problems:   S/P laparoscopic appendectomyJune 2015 acute appendicitis  Discharge Medications:   Medication List         azelastine 0.1 % nasal spray  Commonly known as:  ASTELIN  Place 1 spray into both nostrils 2 (two) times daily as needed for rhinitis or allergies. Use in each nostril as directed     CRANBERRY PO  Take 300 mg by mouth every morning.     diphenhydrAMINE 25 mg capsule  Commonly known as:  BENADRYL  Take 25 mg by mouth at bedtime.     fish oil-omega-3 fatty acids 1000 MG capsule  Take 1 g by mouth 2 (two) times daily.     fluconazole 100 MG tablet  Commonly known as:  DIFLUCAN  Take 1 tablet (100 mg total) by mouth daily.     levonorgestrel 20 MCG/24HR IUD  Commonly known as:  MIRENA  1 each by Intrauterine route once. INSERTED 0/3/09     lisinopril-hydrochlorothiazide 20-12.5 MG per tablet  Commonly known as:  PRINZIDE,ZESTORETIC  Take 1 tablet by mouth daily.     Lutein 20 MG Caps  Take 20 mg by mouth every morning.     multivitamin capsule  Take 1 capsule by mouth daily.     pravastatin 40 MG tablet  Commonly known as:   PRAVACHOL  Take 40 mg by mouth daily.     PROBIOTIC PO  Take 1 capsule by mouth every morning.     pseudoephedrine 60 MG tablet  Commonly known as:  SUDAFED  Take 120 mg by mouth daily.     vitamin C 250 MG tablet  Commonly known as:  ASCORBIC ACID  Take 250 mg by mouth every morning.     VITAMIN D PO  Take by mouth.        Discharge Instructions:     Follow-up Information   Follow up with [facility] On 12/01/2013. (1:45pm, arrive tby 1:15pm for paperwork)    Contact information:   [address]   [place] [address] [phone]       Signed: Letha CapeOSBORNE,KELLY E 11/12/2013, 11:14 AM

## 2013-12-01 ENCOUNTER — Ambulatory Visit (INDEPENDENT_AMBULATORY_CARE_PROVIDER_SITE_OTHER): Payer: BC Managed Care – PPO | Admitting: General Surgery

## 2013-12-01 ENCOUNTER — Encounter (INDEPENDENT_AMBULATORY_CARE_PROVIDER_SITE_OTHER): Payer: Self-pay

## 2013-12-01 VITALS — BP 126/72 | HR 95 | Temp 98.0°F | Ht 66.0 in | Wt 225.0 lb

## 2013-12-01 DIAGNOSIS — Z9889 Other specified postprocedural states: Secondary | ICD-10-CM

## 2013-12-01 DIAGNOSIS — Z9049 Acquired absence of other specified parts of digestive tract: Secondary | ICD-10-CM

## 2013-12-01 NOTE — Progress Notes (Signed)
Terri KindlerMARRENE Gatchell [birth year] [id number] 12/01/2013   History of Present Illness: Terri KindlerMarrene Campbell is a  48 y.o. female who presents today status post lap appy by Dr. Daphine DeutscherMartin.  Pathology reveals acute appendicitis.  The patient is tolerating a regular diet, having normal bowel movements, has good pain control.  She is back to most normal activities.   Physical Exam: Abd: soft, nontender, active bowel sounds, nondistended.  All incisions are well healed.  Filed Vitals:   12/01/13 1354  BP: 126/72  Pulse: 95  Temp: 98 F (36.7 C)     Impression: 1.  Acute appendicitis, s/p lap appy  Plan: She  is able to return to normal activities. She may follow up on a prn basis.  Madhavi Hamblen, ANP-BC

## 2013-12-01 NOTE — Patient Instructions (Signed)
You may return to normal activities.  Follow up as needed.  Thank you for allowing us to be a part of your care.

## 2014-04-02 ENCOUNTER — Other Ambulatory Visit: Payer: Self-pay | Admitting: Dermatology

## 2014-08-04 ENCOUNTER — Other Ambulatory Visit (HOSPITAL_COMMUNITY): Payer: Self-pay | Admitting: Family Medicine

## 2014-08-04 DIAGNOSIS — Z1231 Encounter for screening mammogram for malignant neoplasm of breast: Secondary | ICD-10-CM

## 2014-08-31 ENCOUNTER — Ambulatory Visit (HOSPITAL_COMMUNITY)
Admission: RE | Admit: 2014-08-31 | Discharge: 2014-08-31 | Disposition: A | Discharge status: Home / Self Care | Payer: BLUE CROSS/BLUE SHIELD | Source: Ambulatory Visit | Attending: Family Medicine | Admitting: Family Medicine

## 2014-08-31 DIAGNOSIS — Z1231 Encounter for screening mammogram for malignant neoplasm of breast: Secondary | ICD-10-CM | POA: Diagnosis not present

## 2015-09-09 ENCOUNTER — Other Ambulatory Visit: Payer: Self-pay

## 2015-09-09 DIAGNOSIS — Z1231 Encounter for screening mammogram for malignant neoplasm of breast: Secondary | ICD-10-CM

## 2015-10-05 ENCOUNTER — Ambulatory Visit: Payer: Managed Care, Other (non HMO)

## 2015-10-17 ENCOUNTER — Ambulatory Visit: Payer: Managed Care, Other (non HMO)

## 2015-11-03 ENCOUNTER — Ambulatory Visit
Admission: RE | Admit: 2015-11-03 | Discharge: 2015-11-03 | Disposition: A | Discharge status: Home / Self Care | Payer: Managed Care, Other (non HMO) | Source: Ambulatory Visit

## 2015-11-03 DIAGNOSIS — Z1231 Encounter for screening mammogram for malignant neoplasm of breast: Secondary | ICD-10-CM

## 2016-01-19 ENCOUNTER — Other Ambulatory Visit (HOSPITAL_COMMUNITY)
Admission: RE | Admit: 2016-01-19 | Discharge: 2016-01-19 | Disposition: A | Discharge status: Home / Self Care | Payer: Managed Care, Other (non HMO) | Source: Ambulatory Visit | Attending: Family Medicine | Admitting: Family Medicine

## 2016-01-19 ENCOUNTER — Other Ambulatory Visit: Payer: Self-pay | Admitting: Family Medicine

## 2016-01-19 DIAGNOSIS — Z1151 Encounter for screening for human papillomavirus (HPV): Secondary | ICD-10-CM | POA: Insufficient documentation

## 2016-01-19 DIAGNOSIS — Z01419 Encounter for gynecological examination (general) (routine) without abnormal findings: Secondary | ICD-10-CM | POA: Diagnosis present

## 2016-01-20 LAB — CYTOLOGY - PAP

## 2016-08-22 DIAGNOSIS — E7800 Pure hypercholesterolemia, unspecified: Secondary | ICD-10-CM | POA: Diagnosis not present

## 2016-08-22 DIAGNOSIS — Z975 Presence of (intrauterine) contraceptive device: Secondary | ICD-10-CM | POA: Diagnosis not present

## 2016-08-22 DIAGNOSIS — I10 Essential (primary) hypertension: Secondary | ICD-10-CM | POA: Diagnosis not present

## 2016-08-22 DIAGNOSIS — J309 Allergic rhinitis, unspecified: Secondary | ICD-10-CM | POA: Diagnosis not present

## 2016-08-22 DIAGNOSIS — E669 Obesity, unspecified: Secondary | ICD-10-CM | POA: Diagnosis not present

## 2016-11-12 ENCOUNTER — Other Ambulatory Visit: Payer: Self-pay | Admitting: Family Medicine

## 2016-11-12 DIAGNOSIS — Z1231 Encounter for screening mammogram for malignant neoplasm of breast: Secondary | ICD-10-CM

## 2016-11-27 ENCOUNTER — Ambulatory Visit: Payer: Managed Care, Other (non HMO)

## 2016-12-05 ENCOUNTER — Ambulatory Visit
Admission: RE | Admit: 2016-12-05 | Discharge: 2016-12-05 | Disposition: A | Discharge status: Home / Self Care | Payer: BLUE CROSS/BLUE SHIELD | Source: Ambulatory Visit | Attending: Family Medicine | Admitting: Family Medicine

## 2016-12-05 DIAGNOSIS — Z1231 Encounter for screening mammogram for malignant neoplasm of breast: Secondary | ICD-10-CM

## 2017-02-07 DIAGNOSIS — Z0000 Encounter for general adult medical examination without abnormal findings: Secondary | ICD-10-CM | POA: Diagnosis not present

## 2017-02-07 DIAGNOSIS — I10 Essential (primary) hypertension: Secondary | ICD-10-CM | POA: Diagnosis not present

## 2017-02-07 DIAGNOSIS — E7800 Pure hypercholesterolemia, unspecified: Secondary | ICD-10-CM | POA: Diagnosis not present

## 2017-02-07 DIAGNOSIS — M545 Low back pain: Secondary | ICD-10-CM | POA: Diagnosis not present

## 2017-02-07 DIAGNOSIS — J309 Allergic rhinitis, unspecified: Secondary | ICD-10-CM | POA: Diagnosis not present

## 2017-03-18 DIAGNOSIS — Z1211 Encounter for screening for malignant neoplasm of colon: Secondary | ICD-10-CM | POA: Diagnosis not present

## 2017-04-08 DIAGNOSIS — H31002 Unspecified chorioretinal scars, left eye: Secondary | ICD-10-CM | POA: Diagnosis not present

## 2017-04-10 DIAGNOSIS — L821 Other seborrheic keratosis: Secondary | ICD-10-CM | POA: Diagnosis not present

## 2017-04-10 DIAGNOSIS — D229 Melanocytic nevi, unspecified: Secondary | ICD-10-CM | POA: Diagnosis not present

## 2017-04-10 DIAGNOSIS — L720 Epidermal cyst: Secondary | ICD-10-CM | POA: Diagnosis not present

## 2017-05-30 ENCOUNTER — Other Ambulatory Visit: Payer: Self-pay | Admitting: Dermatology

## 2017-05-30 DIAGNOSIS — L720 Epidermal cyst: Secondary | ICD-10-CM | POA: Diagnosis not present

## 2017-08-08 DIAGNOSIS — Z23 Encounter for immunization: Secondary | ICD-10-CM | POA: Diagnosis not present

## 2017-08-12 DIAGNOSIS — J309 Allergic rhinitis, unspecified: Secondary | ICD-10-CM | POA: Diagnosis not present

## 2017-08-12 DIAGNOSIS — E7800 Pure hypercholesterolemia, unspecified: Secondary | ICD-10-CM | POA: Diagnosis not present

## 2017-08-12 DIAGNOSIS — M545 Low back pain: Secondary | ICD-10-CM | POA: Diagnosis not present

## 2017-08-12 DIAGNOSIS — I10 Essential (primary) hypertension: Secondary | ICD-10-CM | POA: Diagnosis not present

## 2017-09-02 DIAGNOSIS — R21 Rash and other nonspecific skin eruption: Secondary | ICD-10-CM | POA: Diagnosis not present

## 2017-09-25 DIAGNOSIS — D229 Melanocytic nevi, unspecified: Secondary | ICD-10-CM | POA: Diagnosis not present

## 2017-09-25 DIAGNOSIS — L723 Sebaceous cyst: Secondary | ICD-10-CM | POA: Diagnosis not present

## 2017-10-29 ENCOUNTER — Other Ambulatory Visit: Payer: Self-pay | Admitting: Dermatology

## 2017-10-29 DIAGNOSIS — L723 Sebaceous cyst: Secondary | ICD-10-CM | POA: Diagnosis not present

## 2017-11-26 ENCOUNTER — Other Ambulatory Visit: Payer: Self-pay | Admitting: Family Medicine

## 2017-11-26 DIAGNOSIS — Z1231 Encounter for screening mammogram for malignant neoplasm of breast: Secondary | ICD-10-CM

## 2017-12-17 ENCOUNTER — Ambulatory Visit
Admission: RE | Admit: 2017-12-17 | Discharge: 2017-12-17 | Disposition: A | Discharge status: Home / Self Care | Payer: BLUE CROSS/BLUE SHIELD | Source: Ambulatory Visit | Attending: Family Medicine | Admitting: Family Medicine

## 2017-12-17 DIAGNOSIS — Z1231 Encounter for screening mammogram for malignant neoplasm of breast: Secondary | ICD-10-CM

## 2018-03-05 DIAGNOSIS — Z0000 Encounter for general adult medical examination without abnormal findings: Secondary | ICD-10-CM | POA: Diagnosis not present

## 2018-03-05 DIAGNOSIS — E7800 Pure hypercholesterolemia, unspecified: Secondary | ICD-10-CM | POA: Diagnosis not present

## 2018-03-05 DIAGNOSIS — Z975 Presence of (intrauterine) contraceptive device: Secondary | ICD-10-CM | POA: Diagnosis not present

## 2018-03-05 DIAGNOSIS — J309 Allergic rhinitis, unspecified: Secondary | ICD-10-CM | POA: Diagnosis not present

## 2018-03-05 DIAGNOSIS — I10 Essential (primary) hypertension: Secondary | ICD-10-CM | POA: Diagnosis not present

## 2018-03-19 DIAGNOSIS — Z30430 Encounter for insertion of intrauterine contraceptive device: Secondary | ICD-10-CM | POA: Diagnosis not present

## 2018-05-02 DIAGNOSIS — Z23 Encounter for immunization: Secondary | ICD-10-CM | POA: Diagnosis not present

## 2018-05-02 DIAGNOSIS — Z30433 Encounter for removal and reinsertion of intrauterine contraceptive device: Secondary | ICD-10-CM | POA: Diagnosis not present

## 2018-05-02 DIAGNOSIS — Z3202 Encounter for pregnancy test, result negative: Secondary | ICD-10-CM | POA: Diagnosis not present

## 2018-06-02 DIAGNOSIS — Z30431 Encounter for routine checking of intrauterine contraceptive device: Secondary | ICD-10-CM | POA: Diagnosis not present

## 2018-06-02 DIAGNOSIS — Z975 Presence of (intrauterine) contraceptive device: Secondary | ICD-10-CM | POA: Diagnosis not present

## 2018-11-12 DIAGNOSIS — N183 Chronic kidney disease, stage 3 (moderate): Secondary | ICD-10-CM | POA: Diagnosis not present

## 2018-11-12 DIAGNOSIS — E7800 Pure hypercholesterolemia, unspecified: Secondary | ICD-10-CM | POA: Diagnosis not present

## 2018-11-12 DIAGNOSIS — I129 Hypertensive chronic kidney disease with stage 1 through stage 4 chronic kidney disease, or unspecified chronic kidney disease: Secondary | ICD-10-CM | POA: Diagnosis not present

## 2019-01-06 ENCOUNTER — Other Ambulatory Visit: Payer: Self-pay | Admitting: Family Medicine

## 2019-01-06 DIAGNOSIS — Z1231 Encounter for screening mammogram for malignant neoplasm of breast: Secondary | ICD-10-CM

## 2019-01-08 DIAGNOSIS — M4155 Other secondary scoliosis, thoracolumbar region: Secondary | ICD-10-CM | POA: Diagnosis not present

## 2019-01-08 DIAGNOSIS — M545 Low back pain: Secondary | ICD-10-CM | POA: Diagnosis not present

## 2019-01-08 DIAGNOSIS — M21372 Foot drop, left foot: Secondary | ICD-10-CM | POA: Diagnosis not present

## 2019-01-08 DIAGNOSIS — M5416 Radiculopathy, lumbar region: Secondary | ICD-10-CM | POA: Diagnosis not present

## 2019-01-08 DIAGNOSIS — M546 Pain in thoracic spine: Secondary | ICD-10-CM | POA: Diagnosis not present

## 2019-01-08 DIAGNOSIS — M4316 Spondylolisthesis, lumbar region: Secondary | ICD-10-CM | POA: Diagnosis not present

## 2019-01-09 ENCOUNTER — Other Ambulatory Visit (HOSPITAL_COMMUNITY): Payer: Self-pay | Admitting: Neurosurgery

## 2019-01-12 ENCOUNTER — Other Ambulatory Visit: Payer: Self-pay | Admitting: Neurosurgery

## 2019-01-12 DIAGNOSIS — M5416 Radiculopathy, lumbar region: Secondary | ICD-10-CM

## 2019-01-13 DIAGNOSIS — M545 Low back pain: Secondary | ICD-10-CM | POA: Diagnosis not present

## 2019-01-13 DIAGNOSIS — M21372 Foot drop, left foot: Secondary | ICD-10-CM | POA: Diagnosis not present

## 2019-01-13 DIAGNOSIS — M5136 Other intervertebral disc degeneration, lumbar region: Secondary | ICD-10-CM | POA: Diagnosis not present

## 2019-01-13 DIAGNOSIS — M4155 Other secondary scoliosis, thoracolumbar region: Secondary | ICD-10-CM | POA: Diagnosis not present

## 2019-01-13 DIAGNOSIS — M5416 Radiculopathy, lumbar region: Secondary | ICD-10-CM | POA: Diagnosis not present

## 2019-01-15 ENCOUNTER — Other Ambulatory Visit: Payer: Self-pay

## 2019-01-15 DIAGNOSIS — Z20828 Contact with and (suspected) exposure to other viral communicable diseases: Secondary | ICD-10-CM | POA: Diagnosis not present

## 2019-01-15 DIAGNOSIS — M21372 Foot drop, left foot: Secondary | ICD-10-CM

## 2019-01-23 ENCOUNTER — Other Ambulatory Visit: Payer: Self-pay | Admitting: Neurosurgery

## 2019-01-23 DIAGNOSIS — M4316 Spondylolisthesis, lumbar region: Secondary | ICD-10-CM

## 2019-01-26 ENCOUNTER — Telehealth: Payer: Self-pay | Admitting: Nurse Practitioner

## 2019-01-26 NOTE — Telephone Encounter (Signed)
Phone call to patient to verify medication list and allergies for myelogram procedure. Pt aware she will not need to hold any medications for this procedure. Pre and post procedure instructions reviewed with pt. 

## 2019-01-27 ENCOUNTER — Other Ambulatory Visit: Payer: Self-pay

## 2019-01-27 ENCOUNTER — Ambulatory Visit (INDEPENDENT_AMBULATORY_CARE_PROVIDER_SITE_OTHER): Payer: BC Managed Care – PPO | Admitting: Neurology

## 2019-01-27 DIAGNOSIS — M21372 Foot drop, left foot: Secondary | ICD-10-CM

## 2019-01-27 DIAGNOSIS — M5417 Radiculopathy, lumbosacral region: Secondary | ICD-10-CM

## 2019-01-27 NOTE — Procedures (Signed)
[facility]  [address], [place]  [place], [place] [address] Tel: [phone] Fax:  [phone] Test Date:  01/27/2019  Patient: Terri Campbell DOB: 1965/06/27 Physician: Narda Amber, DO  Sex: Female Height: 5\' 6"  Ref Phys: Hosie Spangle, MD  ID#: [id number] Temp: 32.0C Technician:    Patient Complaints: This is a 53 year old female referred for evaluation of left foot drop.  NCV & EMG Findings: Extensive electrodiagnostic testing of the left lower extremity and additional studies of the right shows:  1. Bilateral sural and superficial peroneal sensory responses are within normal limits. 2. Left peroneal motor response shows asymmetrically reduced amplitudes on the left at the tibialis anterior (L4.6, R7.2 mV) and extensor digitorum brevis (L2.5, R5.2 mV).  Bilateral tibial motor responses are within normal limits. 3. Bilateral tibial H reflex studies are within normal limits 4. Active on chronic motor axonal loss changes are seen affecting the left tibialis anterior, flexor digitorum longus, and gluteus medius muscles.  Fibrillation potentials are not seen in the lower lumbar paraspinal muscles.  Impression: 1. Subacute L5 radiculopathy affecting the left lower extremity, moderate. 2. There is no evidence of a sensorimotor polyneuropathy or peroneal mononeuropathy.   ___________________________ Narda Amber, DO    Nerve Conduction Studies Anti Sensory Summary Table   Site NR Peak (ms) Norm Peak (ms) P-T Amp (V) Norm P-T Amp  Left Sup Peroneal Anti Sensory (Ant Lat Mall)  32C  12 cm    4.3 <4.6 5.9 >4  Right Sup Peroneal Anti Sensory (Ant Lat Mall)  32C  12 cm    2.7 <4.6 5.1 >4  Left Sural Anti Sensory (Lat Mall)  32C  Calf    3.4 <4.6 11.5 >4  Right Sural Anti Sensory (Lat Mall)  32C  Calf    3.1 <4.6 12.1 >4   Motor Summary Table   Site NR Onset (ms) Norm Onset (ms) O-P Amp (mV) Norm O-P Amp Site1 Site2 Delta-0 (ms) Dist (cm) Vel (m/s)  Norm Vel (m/s)  Left Peroneal Motor (Ext Dig Brev)  32C  Ankle    5.9 <6.0 2.5 >2.5 B Fib Ankle 8.2 36.0 44 >40  B Fib    14.1  2.2  Poplt B Fib 1.4 7.0 50 >40  Poplt    15.5  2.1         Right Peroneal Motor (Ext Dig Brev)  32C  Ankle    5.1 <6.0 5.2 >2.5 B Fib Ankle 7.1 38.0 54 >40  B Fib    12.2  5.0  Poplt B Fib 1.4 9.0 64 >40  Poplt    13.6  4.9         Left Peroneal TA Motor (Tib Ant)  32C  Fib Head    2.7 <4.5 4.6 >3 Poplit Fib Head 1.5 7.0 47 >40  Poplit    4.2  4.3         Right Peroneal TA Motor (Tib Ant)  32C  Fib Head    2.2 <4.5 7.2 >3 Poplit Fib Head 1.5 8.0 53 >40  Poplit    3.7  6.8         Left Tibial Motor (Abd Hall Brev)  32C  Ankle    2.4 <6.0 18.1 >4 Knee Ankle 9.1 42.0 46 >40  Knee    11.5  12.3         Right Tibial Motor (Abd Hall Brev)  32C  Ankle    2.5 <6.0 18.4 >4 Knee  Ankle 10.1 42.0 42 >40  Knee    12.6  11.2          H Reflex Studies   NR H-Lat (ms) Lat Norm (ms) L-R H-Lat (ms)  Left Tibial (Gastroc)  32C     32.24 <35 0.81  Right Tibial (Gastroc)  32C     31.43 <35 0.81   EMG   Side Muscle Ins Act Fibs Psw Fasc Number Recrt Dur Dur. Amp Amp. Poly Poly. Comment  Left AntTibialis Nml 2+ Nml Nml 3- Rapid Most 1+ Most 1+ Most 1+ N/A  Left Gastroc Nml Nml Nml Nml Nml Nml Nml Nml Nml Nml Nml Nml N/A  Left Flex Dig Long Nml 2+ Nml Nml 3- Rapid Many 1+ Many 1+ Nml Nml N/A  Left RectFemoris Nml Nml Nml Nml Nml Nml Nml Nml Nml Nml Nml Nml N/A  Left GluteusMed Nml 1+ Nml Nml 2- Rapid Some 1+ Some 1+ Some 1+ N/A  Left Lumbo Parasp Low Nml Nml Nml Nml Nml Nml Nml Nml Nml Nml Nml Nml N/A  Left BicepsFemS Nml Nml Nml Nml Nml Nml Nml Nml Nml Nml Nml Nml N/A  Right AntTibialis Nml Nml Nml Nml Nml Nml Nml Nml Nml Nml Nml Nml N/A  Right Gastroc Nml Nml Nml Nml Nml Nml Nml Nml Nml Nml Nml Nml N/A  Right Flex Dig Long Nml Nml Nml Nml Nml Nml Nml Nml Nml Nml Nml Nml N/A  Right RectFemoris Nml Nml Nml Nml Nml Nml Nml Nml Nml Nml Nml Nml N/A  Right  GluteusMed Nml Nml Nml Nml Nml Nml Nml Nml Nml Nml Nml Nml N/A      Waveforms:

## 2019-02-05 ENCOUNTER — Ambulatory Visit
Admission: RE | Admit: 2019-02-05 | Discharge: 2019-02-05 | Disposition: A | Discharge status: Home / Self Care | Payer: BC Managed Care – PPO | Source: Ambulatory Visit | Attending: Neurosurgery | Admitting: Neurosurgery

## 2019-02-05 DIAGNOSIS — M48061 Spinal stenosis, lumbar region without neurogenic claudication: Secondary | ICD-10-CM | POA: Diagnosis not present

## 2019-02-05 DIAGNOSIS — M4316 Spondylolisthesis, lumbar region: Secondary | ICD-10-CM | POA: Diagnosis not present

## 2019-02-05 DIAGNOSIS — M5126 Other intervertebral disc displacement, lumbar region: Secondary | ICD-10-CM | POA: Diagnosis not present

## 2019-02-05 MED ORDER — IOPAMIDOL (ISOVUE-M 200) INJECTION 41%
20.0000 mL | Freq: Once | INTRAMUSCULAR | Status: AC
  Administered 2019-02-05: 20 mL via INTRATHECAL

## 2019-02-05 MED ORDER — DIAZEPAM 5 MG PO TABS
10.0000 mg | ORAL_TABLET | Freq: Once | ORAL | Status: AC
  Administered 2019-02-05: 10 mg via ORAL

## 2019-02-05 NOTE — Discharge Instructions (Signed)

## 2019-02-09 DIAGNOSIS — M5416 Radiculopathy, lumbar region: Secondary | ICD-10-CM | POA: Diagnosis not present

## 2019-02-09 DIAGNOSIS — M5136 Other intervertebral disc degeneration, lumbar region: Secondary | ICD-10-CM | POA: Diagnosis not present

## 2019-02-09 DIAGNOSIS — M48062 Spinal stenosis, lumbar region with neurogenic claudication: Secondary | ICD-10-CM | POA: Diagnosis not present

## 2019-02-09 DIAGNOSIS — M21372 Foot drop, left foot: Secondary | ICD-10-CM | POA: Diagnosis not present

## 2019-02-12 DIAGNOSIS — Z1159 Encounter for screening for other viral diseases: Secondary | ICD-10-CM | POA: Diagnosis not present

## 2019-02-18 DIAGNOSIS — M5136 Other intervertebral disc degeneration, lumbar region: Secondary | ICD-10-CM | POA: Diagnosis not present

## 2019-02-18 DIAGNOSIS — M48061 Spinal stenosis, lumbar region without neurogenic claudication: Secondary | ICD-10-CM | POA: Diagnosis not present

## 2019-02-18 DIAGNOSIS — M5116 Intervertebral disc disorders with radiculopathy, lumbar region: Secondary | ICD-10-CM | POA: Diagnosis not present

## 2019-02-18 DIAGNOSIS — M21372 Foot drop, left foot: Secondary | ICD-10-CM | POA: Diagnosis not present

## 2019-02-18 DIAGNOSIS — M4726 Other spondylosis with radiculopathy, lumbar region: Secondary | ICD-10-CM | POA: Diagnosis not present

## 2019-02-18 DIAGNOSIS — M4316 Spondylolisthesis, lumbar region: Secondary | ICD-10-CM | POA: Diagnosis not present

## 2019-02-18 DIAGNOSIS — M48062 Spinal stenosis, lumbar region with neurogenic claudication: Secondary | ICD-10-CM | POA: Diagnosis not present

## 2019-02-19 ENCOUNTER — Ambulatory Visit: Payer: BLUE CROSS/BLUE SHIELD

## 2019-02-24 ENCOUNTER — Encounter: Payer: BLUE CROSS/BLUE SHIELD | Admitting: Neurology

## 2019-03-13 DIAGNOSIS — E7800 Pure hypercholesterolemia, unspecified: Secondary | ICD-10-CM | POA: Diagnosis not present

## 2019-03-13 DIAGNOSIS — I129 Hypertensive chronic kidney disease with stage 1 through stage 4 chronic kidney disease, or unspecified chronic kidney disease: Secondary | ICD-10-CM | POA: Diagnosis not present

## 2019-03-13 DIAGNOSIS — Z1159 Encounter for screening for other viral diseases: Secondary | ICD-10-CM | POA: Diagnosis not present

## 2019-03-16 DIAGNOSIS — H5213 Myopia, bilateral: Secondary | ICD-10-CM | POA: Diagnosis not present

## 2019-03-16 DIAGNOSIS — H43813 Vitreous degeneration, bilateral: Secondary | ICD-10-CM | POA: Diagnosis not present

## 2019-03-16 DIAGNOSIS — H52203 Unspecified astigmatism, bilateral: Secondary | ICD-10-CM | POA: Diagnosis not present

## 2019-03-17 DIAGNOSIS — N183 Chronic kidney disease, stage 3 unspecified: Secondary | ICD-10-CM | POA: Diagnosis not present

## 2019-03-17 DIAGNOSIS — B009 Herpesviral infection, unspecified: Secondary | ICD-10-CM | POA: Diagnosis not present

## 2019-03-17 DIAGNOSIS — I129 Hypertensive chronic kidney disease with stage 1 through stage 4 chronic kidney disease, or unspecified chronic kidney disease: Secondary | ICD-10-CM | POA: Diagnosis not present

## 2019-03-17 DIAGNOSIS — Z1159 Encounter for screening for other viral diseases: Secondary | ICD-10-CM | POA: Diagnosis not present

## 2019-03-17 DIAGNOSIS — Z0000 Encounter for general adult medical examination without abnormal findings: Secondary | ICD-10-CM | POA: Diagnosis not present

## 2019-03-17 DIAGNOSIS — E7800 Pure hypercholesterolemia, unspecified: Secondary | ICD-10-CM | POA: Diagnosis not present

## 2019-03-17 DIAGNOSIS — Z23 Encounter for immunization: Secondary | ICD-10-CM | POA: Diagnosis not present

## 2019-03-17 DIAGNOSIS — J309 Allergic rhinitis, unspecified: Secondary | ICD-10-CM | POA: Diagnosis not present

## 2019-03-24 ENCOUNTER — Other Ambulatory Visit: Payer: Self-pay

## 2019-03-24 ENCOUNTER — Ambulatory Visit
Admission: RE | Admit: 2019-03-24 | Discharge: 2019-03-24 | Disposition: A | Discharge status: Home / Self Care | Payer: BC Managed Care – PPO | Source: Ambulatory Visit | Attending: Family Medicine | Admitting: Family Medicine

## 2019-03-24 DIAGNOSIS — Z1231 Encounter for screening mammogram for malignant neoplasm of breast: Secondary | ICD-10-CM

## 2019-04-15 DIAGNOSIS — L821 Other seborrheic keratosis: Secondary | ICD-10-CM | POA: Diagnosis not present

## 2019-04-15 DIAGNOSIS — D229 Melanocytic nevi, unspecified: Secondary | ICD-10-CM | POA: Diagnosis not present

## 2019-04-15 DIAGNOSIS — L918 Other hypertrophic disorders of the skin: Secondary | ICD-10-CM | POA: Diagnosis not present

## 2019-12-22 ENCOUNTER — Encounter: Payer: Self-pay | Admitting: Dermatology

## 2019-12-22 ENCOUNTER — Ambulatory Visit (INDEPENDENT_AMBULATORY_CARE_PROVIDER_SITE_OTHER): Payer: 59 | Admitting: Dermatology

## 2019-12-22 ENCOUNTER — Other Ambulatory Visit: Payer: Self-pay

## 2019-12-22 DIAGNOSIS — L720 Epidermal cyst: Secondary | ICD-10-CM | POA: Diagnosis not present

## 2019-12-22 DIAGNOSIS — L918 Other hypertrophic disorders of the skin: Secondary | ICD-10-CM

## 2019-12-22 NOTE — Patient Instructions (Signed)
Epidermal Cyst  An epidermal cyst is a small, painless lump under your skin. The cyst contains a grayish-white, bad-smelling substance (keratin). Do not try to pop or open an epidermal cyst yourself. What are the causes?  A blocked hair follicle.  A hair that curls and re-enters the skin instead of growing straight out of the skin.  A blocked pore.  Irritated skin.  An injury to the skin.  Certain conditions that are passed along from parent to child (inherited).  Human papillomavirus (HPV).  Long-term sun damage to the skin. What increases the risk?  Having acne.  Being overweight.  Being 30-40 years old. What are the signs or symptoms? These cysts are usually harmless, but they can get infected. Symptoms of infection may include:  Redness.  Inflammation.  Tenderness.  Warmth.  Fever.  A grayish-white, bad-smelling substance drains from the cyst.  Pus drains from the cyst. How is this treated? In many cases, epidermal cysts go away on their own without treatment. If a cyst becomes infected, treatment may include:  Opening and draining the cyst, done by a doctor. After draining, you may need minor surgery to remove the rest of the cyst.  Antibiotic medicine.  Shots of medicines (steroids) that help to reduce inflammation.  Surgery to remove the cyst. Surgery may be done if the cyst: ? Becomes large. ? Bothers you. ? Has a chance of turning into cancer.  Do not try to open a cyst yourself. Follow these instructions at home:  Take over-the-counter and prescription medicines only as told by your doctor.  If you were prescribed an antibiotic medicine, take it it as told by your doctor. Do not stop using the antibiotic even if you start to feel better.  Keep the area around your cyst clean and dry.  Wear loose, dry clothing.  Avoid touching your cyst.  Check your cyst every day for signs of infection. Check for: ? Redness, swelling, or pain. ? Fluid  or blood. ? Warmth. ? Pus or a bad smell.  Keep all follow-up visits as told by your doctor. This is important. How is this prevented?  Wear clean, dry, clothing.  Avoid wearing tight clothing.  Keep your skin clean and dry. Take showers or baths every day. Contact a doctor if:  Your cyst has symptoms of infection.  Your condition does not improve or gets worse.  You have a cyst that looks different from other cysts you have had.  You have a fever. Get help right away if:  Redness spreads from the cyst into the area close by. Summary  An epidermal cyst is a sac made of skin tissue.  If a cyst becomes infected, treatment may include surgery to open and drain the cyst, or to remove it.  Take over-the-counter and prescription medicines only as told by your doctor.  Contact a doctor if your condition is not improving or is getting worse.  Keep all follow-up visits as told by your doctor. This is important. This information is not intended to replace advice given to you by your health care provider. Make sure you discuss any questions you have with your health care provider. Document Revised: 09/04/2018 Document Reviewed: 02/20/2018 Elsevier Patient Education  2020 Elsevier Inc.  

## 2020-01-11 ENCOUNTER — Other Ambulatory Visit: Payer: Self-pay | Admitting: Neurological Surgery

## 2020-01-21 ENCOUNTER — Inpatient Hospital Stay (HOSPITAL_COMMUNITY): Admission: RE | Admit: 2020-01-21 | Payer: 59 | Source: Ambulatory Visit

## 2020-01-21 ENCOUNTER — Ambulatory Visit (HOSPITAL_COMMUNITY)
Admission: RE | Admit: 2020-01-21 | Discharge: 2020-01-21 | Disposition: A | Discharge status: Home / Self Care | Payer: 59 | Source: Ambulatory Visit | Attending: Neurological Surgery | Admitting: Neurological Surgery

## 2020-01-21 ENCOUNTER — Encounter (HOSPITAL_COMMUNITY)
Admission: RE | Admit: 2020-01-21 | Discharge: 2020-01-21 | Disposition: A | Discharge status: Home / Self Care | Payer: 59 | Source: Ambulatory Visit | Attending: Neurological Surgery | Admitting: Neurological Surgery

## 2020-01-21 ENCOUNTER — Other Ambulatory Visit: Payer: Self-pay

## 2020-01-21 ENCOUNTER — Encounter (HOSPITAL_COMMUNITY): Payer: Self-pay

## 2020-01-21 DIAGNOSIS — M7130 Other bursal cyst, unspecified site: Secondary | ICD-10-CM | POA: Insufficient documentation

## 2020-01-21 DIAGNOSIS — M4310 Spondylolisthesis, site unspecified: Secondary | ICD-10-CM | POA: Diagnosis present

## 2020-01-21 HISTORY — DX: Other complications of anesthesia, initial encounter: T88.59XA

## 2020-01-21 HISTORY — DX: Anxiety disorder, unspecified: F41.9

## 2020-01-21 LAB — SURGICAL PCR SCREEN
MRSA, PCR: NEGATIVE
Staphylococcus aureus: NEGATIVE

## 2020-01-21 LAB — BASIC METABOLIC PANEL
Anion gap: 9 (ref 5–15)
BUN: 12 mg/dL (ref 6–20)
CO2: 28 mmol/L (ref 22–32)
Calcium: 9.9 mg/dL (ref 8.9–10.3)
Chloride: 100 mmol/L (ref 98–111)
Creatinine, Ser: 0.91 mg/dL (ref 0.44–1.00)
GFR calc Af Amer: >60 mL/min (ref >60)
GFR calc non Af Amer: >60 mL/min (ref >60)
Glucose, Bld: 86 mg/dL (ref 70–99)
Potassium: 3.4 mmol/L — ABNORMAL LOW (ref 3.5–5.1)
Sodium: 137 mmol/L (ref 135–145)

## 2020-01-21 LAB — CBC WITH DIFFERENTIAL/PLATELET
Abs Immature Granulocytes: 0.04 10*3/uL (ref 0.00–0.07)
Basophils Absolute: 0.1 10*3/uL (ref 0.0–0.1)
Basophils Relative: 1 %
Eosinophils Absolute: 0.2 10*3/uL (ref 0.0–0.5)
Eosinophils Relative: 1 %
HCT: 46.1 % — ABNORMAL HIGH (ref 36.0–46.0)
Hemoglobin: 15.4 g/dL — ABNORMAL HIGH (ref 12.0–15.0)
Immature Granulocytes: 0 %
Lymphocytes Relative: 36 %
Lymphs Abs: 4 10*3/uL (ref 0.7–4.0)
MCH: 30.1 pg (ref 26.0–34.0)
MCHC: 33.4 g/dL (ref 30.0–36.0)
MCV: 90.2 fL (ref 80.0–100.0)
Monocytes Absolute: 0.9 10*3/uL (ref 0.1–1.0)
Monocytes Relative: 8 %
Neutro Abs: 5.9 10*3/uL (ref 1.7–7.7)
Neutrophils Relative %: 54 %
Platelets: 485 10*3/uL — ABNORMAL HIGH (ref 150–400)
RBC: 5.11 MIL/uL (ref 3.87–5.11)
RDW: 13.6 % (ref 11.5–15.5)
WBC: 11.1 10*3/uL — ABNORMAL HIGH (ref 4.0–10.5)
nRBC: 0 % (ref 0.0–0.2)

## 2020-01-21 LAB — TYPE AND SCREEN
ABO/RH(D): AB POS
Antibody Screen: NEGATIVE

## 2020-01-21 LAB — PROTIME-INR
INR: 1.1 (ref 0.8–1.2)
Prothrombin Time: 13.3 seconds (ref 11.4–15.2)

## 2020-01-21 MED ORDER — CHLORHEXIDINE GLUCONATE CLOTH 2 % EX PADS: 6.0000 | MEDICATED_PAD | Freq: Once | CUTANEOUS | Status: DC

## 2020-01-21 NOTE — Pre-Procedure Instructions (Signed)
Terri Campbell  01/21/2020      [id number] - [place], [place] - [address] [place] [address] Phone: [phone] Fax: [phone]    Your procedure is scheduled on 01/27/20.  Report to [facility] Admitting at 645 A.M.  Call this number if you have problems the morning of surgery:  [phone]   Remember:  Do not eat or drink after midnight.  Y Take these medicines the morning of surgery with A SIP OF WATER -----cetirizine (ZYRTEC) 10 MG     Do not wear jewelry, make-up or nail polish.  Do not wear lotions, powders, or perfumes, or deodorant.  Do not shave 48 hours prior to surgery.  Men may shave face and neck.  Do not bring valuables to the hospital.  [facility] is not responsible for any belongings or valuables.  Contacts, dentures or bridgework may not be worn into surgery.  Leave your suitcase in the car.  After surgery it may be brought to your room.  For patients admitted to the hospital, discharge time will be determined by your treatment team.  Patients discharged the day of surgery will not be allowed to drive home.   NSpecial instructions:  Do not take any aspirin,anti-inflammatories,vitamins,or herbal supplements 5-7 days prior to surgery. [facility] - Preparing for Surgery  Before surgery, you can play an important role.  Because skin is not sterile, your skin needs to be as free of germs as possible.  You can reduce the number of germs on you skin by washing with CHG (chlorahexidine gluconate) soap before surgery.  CHG is an antiseptic cleaner which kills germs and bonds with the skin to continue killing germs even after washing.  Oral Hygiene is also important in reducing the risk of infection.  Remember to brush your teeth with your regular toothpaste the morning of surgery.  Please DO NOT use if you have an allergy to CHG or antibacterial soaps.  If your skin becomes reddened/irritated stop using the CHG and  inform your nurse when you arrive at Short Stay.  Do not shave (including legs and underarms) for at least 48 hours prior to the first CHG shower.  You may shave your face.  Please follow these instructions carefully:   1.  Shower with CHG Soap the night before surgery and the morning of Surgery.  2.  If you choose to wash your hair, wash your hair first as usual with your normal shampoo.  3.  After you shampoo, rinse your hair and body thoroughly to remove the shampoo. 4.  Use CHG as you would any other liquid soap.  You can apply chg directly to the skin and wash gently with a      scrungie or washcloth.           5.  Apply the CHG Soap to your body ONLY FROM THE NECK DOWN.   Do not use on open wounds or open sores. Avoid contact with your eyes, ears, mouth and genitals (private parts).  Wash genitals (private parts) with your normal soap.  6.  Wash thoroughly, paying special attention to the area where your surgery will be performed.  7.  Thoroughly rinse your body with warm water from the neck down.  8.  DO NOT shower/wash with your normal soap after using and rinsing off the CHG Soap.  9.  Pat yourself dry with a clean towel.  10.  Wear clean pajamas.            11.  Place clean sheets on your bed the night of your first shower and do not sleep with pets.  Day of Surgery  Do not apply any lotions/deoderants the morning of surgery.   Please wear clean clothes to the hospital/surgery center. Remember to brush your teeth with toothpaste.  Please read over the following fact sheets that you were given. Coughing and Deep Breathing

## 2020-01-21 NOTE — Progress Notes (Signed)
PCP - DR C. ROSS   AT [facility]     SHE STATED SHE HAD OFFICE APPT SCHEDULED FOR MONDAY Cardiologist - NA  -     Chest x-ray - TODAY EKG - TODAY Stress Test - NA ECHO -NA  Cardiac CathNA -    -     Blood Thinner Instructions:NONE Aspirin InstructionSTOP:   COVID TEST- FOR MONDAY  Anesthesia review: HTN  Patient denies shortness of breath, fever, cough and chest pain at PAT appointment   All instructions explained to the patient, with a verbal understanding of the material. Patient agrees to go over the instructions while at home for a better understanding. Patient also instructed to self quarantine after being tested for COVID-19. The opportunity to ask questions was provided.

## 2020-01-22 NOTE — Anesthesia Preprocedure Evaluation (Addendum)
Anesthesia Evaluation  Patient identified by MRN, date of birth, ID band Patient awake    Reviewed: Allergy & Precautions, NPO status , Patient's Chart, lab work & pertinent test results  Airway Mallampati: I  TM Distance: >3 FB Neck ROM: Full    Dental   Pulmonary    Pulmonary exam normal        Cardiovascular hypertension, Pt. on medications Normal cardiovascular exam     Neuro/Psych Anxiety    GI/Hepatic   Endo/Other    Renal/GU      Musculoskeletal   Abdominal   Peds  Hematology   Anesthesia Other Findings   Reproductive/Obstetrics                            Anesthesia Physical Anesthesia Plan  ASA: II  Anesthesia Plan: General   Post-op Pain Management:    Induction: Intravenous  PONV Risk Score and Plan: 3 and Midazolam, Ondansetron and Treatment may vary due to age or medical condition  Airway Management Planned: Oral ETT  Additional Equipment:   Intra-op Plan:   Post-operative Plan: Extubation in OR  Informed Consent: I have reviewed the patients History and Physical, chart, labs and discussed the procedure including the risks, benefits and alternatives for the proposed anesthesia with the patient or authorized representative who has indicated his/her understanding and acceptance.       Plan Discussed with: CRNA and Surgeon  Anesthesia Plan Comments: (PAT note written by [name], PA-C. )       Anesthesia Quick Evaluation

## 2020-01-22 NOTE — Progress Notes (Addendum)
Anesthesia Chart Review:  Case: [id number] Date/Time: 01/27/20 0830   Procedure: TLIF - right - L4-L5 (Right Back)   Anesthesia type: General   Pre-op diagnosis: Synovial cyst - spondylolisthesis   [facility] ROOM [facility] / [facility]   Surgeons: [name], MD      DISCUSSION: Patient is a 54 year old female scheduled for the above procedure.  History includes never smoker, HTN, hypercholesterolemia, anxiety, neck surgery (left C6-7 laminotomy/foraminotomy 02/27/00), appendectomy (11/11/13), back surgery. Reports hair loss from anesthesia in the past. BMI is consistent with obesity.   Preoperative COVID-19 test is scheduled for 01/25/2020.  Anesthesia team to evaluate on the day of surgery. Attempting to reach patient to clarify when/if phentermine is on hold.   ADDENDUM 01/26/20 3:36 PM:  Last phentermine dose 01/21/20. 01/25/20 COVID-19 test negative. Dr. Yetta Barre is having patient repeat CXR on 01/26/20, but report is still in process.    VS: BP 138/81   Pulse (!) 105   Temp 37.1 C (Oral)   Resp 20   Ht 5\' 6"  (1.676 m)   Wt 103.7 kg   SpO2 100%   BMI 36.90 kg/m  HR 99, NSR on EKG.   PROVIDERS: , MD his PCP   LABS: Labs reviewed: Acceptable for surgery. (all labs ordered are listed, but only abnormal results are displayed)  Labs Reviewed  BASIC METABOLIC PANEL - Abnormal; Notable for the following components:      Result Value   Potassium 3.4 (*)    All other components within normal limits  CBC WITH DIFFERENTIAL/PLATELET - Abnormal; Notable for the following components:   WBC 11.1 (*)    Hemoglobin 15.4 (*)    HCT 46.1 (*)    Platelets 485 (*)    All other components within normal limits  SURGICAL PCR SCREEN  PROTIME-INR  TYPE AND SCREEN     IMAGES: CXR 01/21/20: FINDINGS: Mediastinum is unremarkable. Heart size normal. Low lung volumes with mild bibasilar atelectasis. Mild left hilar prominence possibly related to low lung volumes and mild  atelectasis. Repeat PA and lateral chest x-ray with deep inspiration suggested. No pleural effusion or pneumothorax. Degenerative change thoracic spine. IMPRESSION: Mild left hilar prominence, possibly related to low lung volumes and mild atelectasis. Repeat PA and lateral chest x-ray with deep inspiration suggested. - Report called to 01/23/20 at Dr. Erie Noe' office. She will review with him regarding recommendations and determine timing of follow-up imaging as he feels indicated. In the absence of respiratory symptoms, do not feel that repeat imaging would have to occur prior to surgery unless preferred by Dr. Yetta Barre.   MRI L-spine 01/05/20 (Report in [facility]/PACS): IMPRESSION: Postoperative and degenerative changes as detailed above [see full report for details]. A new subcentimeter synovial cyst associated with the right L4-L5 facet joint narrows the right lateral recess with compression of the traversing L5 nerve roots.  MRI C-spine 12/22/19 (Report in [facility]/PACS): MPRESSION: 1. Left eccentric disc osteophyte complexes at C5-6 and C6-7 with resultant mild canal and moderate left C6 and C7 foraminal stenosis. Finding could contribute to left-sided symptoms. 2. Right eccentric disc bulge with uncovertebral hypertrophy at C4-5 with resultant mild to moderate right C5 foraminal stenosis.   EKG: 01/21/20: Normal sinus rhythm Normal ECG No significant change since last tracing Confirmed by 01/23/20 ([name]) on 01/21/2020 6:35:25 PM   CV: N/A  Past Medical History:  Diagnosis Date  . Anxiety   . CIN I (cervical intraepithelial neoplasia I)   . Complication  of anesthesia    "my hair fell out  . Elevated cholesterol   . Hypertension     Past Surgical History:  Procedure Laterality Date  . APPENDECTOMY    . BACK SURGERY    . COLPOSCOPY  1992  . LAPAROSCOPIC APPENDECTOMY N/A 11/11/2013   Procedure: APPENDECTOMY LAPAROSCOPIC;  Surgeon: Valarie Merino, MD;  Location: [facility] ORS;   Service: General;  Laterality: N/A;  . NECK SURGERY     disc    MEDICATIONS: . azelastine (ASTELIN) 0.1 % nasal spray  . cetirizine (ZYRTEC) 10 MG tablet  . cholecalciferol (VITAMIN D) 25 MCG (1000 UNIT) tablet  . Cranberry 300 MG tablet  . ibuprofen (ADVIL) 200 MG tablet  . L-Lysine 1000 MG TABS  . Levonorgestrel (SKYLA) 13.5 MG IUD  . lisinopril-hydrochlorothiazide (PRINZIDE,ZESTORETIC) 20-12.5 MG per tablet  . Multiple Vitamin (MULTIVITAMIN WITH MINERALS) TABS tablet  . Omega-3 Fatty Acids (FISH OIL PO)  . phentermine (ADIPEX-P) 37.5 MG tablet  . pravastatin (PRAVACHOL) 40 MG tablet  . Probiotic Product (PROBIOTIC PO)  . pseudoephedrine (SUDAFED) 120 MG 12 hr tablet  . vitamin C (ASCORBIC ACID) 250 MG tablet   No current facility-administered medications for this encounter.    [name], PA-C Surgical Short Stay/Anesthesiology [facility] Phone [phone] [facility] Phone [phone] 01/22/2020 6:30 PM

## 2020-01-25 ENCOUNTER — Other Ambulatory Visit (HOSPITAL_COMMUNITY)
Admission: RE | Admit: 2020-01-25 | Discharge: 2020-01-25 | Disposition: A | Discharge status: Home / Self Care | Payer: 59 | Source: Ambulatory Visit | Attending: Neurological Surgery | Admitting: Neurological Surgery

## 2020-01-25 DIAGNOSIS — Z01812 Encounter for preprocedural laboratory examination: Secondary | ICD-10-CM | POA: Insufficient documentation

## 2020-01-25 DIAGNOSIS — Z20822 Contact with and (suspected) exposure to covid-19: Secondary | ICD-10-CM | POA: Diagnosis not present

## 2020-01-25 LAB — SARS CORONAVIRUS 2 (TAT 6-24 HRS): SARS Coronavirus 2: NEGATIVE

## 2020-01-26 ENCOUNTER — Other Ambulatory Visit: Payer: Self-pay | Admitting: Neurological Surgery

## 2020-01-26 ENCOUNTER — Ambulatory Visit (HOSPITAL_COMMUNITY)
Admission: RE | Admit: 2020-01-26 | Discharge: 2020-01-26 | Disposition: A | Discharge status: Home / Self Care | Payer: 59 | Source: Ambulatory Visit | Attending: Neurological Surgery | Admitting: Neurological Surgery

## 2020-01-26 ENCOUNTER — Other Ambulatory Visit (HOSPITAL_COMMUNITY): Payer: Self-pay | Admitting: Neurological Surgery

## 2020-01-26 DIAGNOSIS — J984 Other disorders of lung: Secondary | ICD-10-CM

## 2020-01-27 ENCOUNTER — Ambulatory Visit (HOSPITAL_COMMUNITY): Payer: 59 | Admitting: Vascular Surgery

## 2020-01-27 ENCOUNTER — Encounter (HOSPITAL_COMMUNITY)
Admission: RE | Disposition: A | Discharge status: Home / Self Care | Payer: Self-pay | Source: Home / Self Care | Attending: Neurological Surgery

## 2020-01-27 ENCOUNTER — Ambulatory Visit (HOSPITAL_COMMUNITY): Payer: 59

## 2020-01-27 ENCOUNTER — Ambulatory Visit (HOSPITAL_COMMUNITY)
Admission: RE | Admit: 2020-01-27 | Discharge: 2020-01-27 | Disposition: A | Discharge status: Home / Self Care | Payer: 59 | Attending: Neurological Surgery | Admitting: Neurological Surgery

## 2020-01-27 ENCOUNTER — Encounter (HOSPITAL_COMMUNITY): Payer: Self-pay | Admitting: Neurological Surgery

## 2020-01-27 ENCOUNTER — Ambulatory Visit (HOSPITAL_COMMUNITY): Payer: 59 | Admitting: Anesthesiology

## 2020-01-27 ENCOUNTER — Other Ambulatory Visit: Payer: Self-pay

## 2020-01-27 DIAGNOSIS — M7138 Other bursal cyst, other site: Secondary | ICD-10-CM | POA: Insufficient documentation

## 2020-01-27 DIAGNOSIS — Z981 Arthrodesis status: Secondary | ICD-10-CM | POA: Diagnosis not present

## 2020-01-27 DIAGNOSIS — Z793 Long term (current) use of hormonal contraceptives: Secondary | ICD-10-CM | POA: Diagnosis not present

## 2020-01-27 DIAGNOSIS — M4316 Spondylolisthesis, lumbar region: Secondary | ICD-10-CM | POA: Diagnosis present

## 2020-01-27 DIAGNOSIS — M48061 Spinal stenosis, lumbar region without neurogenic claudication: Secondary | ICD-10-CM | POA: Diagnosis not present

## 2020-01-27 DIAGNOSIS — I10 Essential (primary) hypertension: Secondary | ICD-10-CM | POA: Insufficient documentation

## 2020-01-27 DIAGNOSIS — Z419 Encounter for procedure for purposes other than remedying health state, unspecified: Secondary | ICD-10-CM

## 2020-01-27 DIAGNOSIS — Z79899 Other long term (current) drug therapy: Secondary | ICD-10-CM | POA: Insufficient documentation

## 2020-01-27 DIAGNOSIS — M47816 Spondylosis without myelopathy or radiculopathy, lumbar region: Secondary | ICD-10-CM | POA: Diagnosis not present

## 2020-01-27 HISTORY — PX: TRANSFORAMINAL LUMBAR INTERBODY FUSION (TLIF) WITH PEDICLE SCREW FIXATION 1 LEVEL: SHX6141

## 2020-01-27 LAB — ABO/RH: ABO/RH(D): AB POS

## 2020-01-27 LAB — POCT PREGNANCY, URINE: Preg Test, Ur: NEGATIVE

## 2020-01-27 SURGERY — TRANSFORAMINAL LUMBAR INTERBODY FUSION (TLIF) WITH PEDICLE SCREW FIXATION 1 LEVEL
Anesthesia: General | Site: Back | Laterality: Right

## 2020-01-27 MED ORDER — SODIUM CHLORIDE (PF) 0.9 % IJ SOLN
INTRAMUSCULAR | Status: DC | PRN
  Administered 2020-01-27: 10 mL via INTRAVENOUS

## 2020-01-27 MED ORDER — MEPERIDINE HCL 25 MG/ML IJ SOLN: 6.2500 mg | INTRAMUSCULAR | Status: DC | PRN

## 2020-01-27 MED ORDER — HYDROCHLOROTHIAZIDE 12.5 MG PO CAPS: 12.5000 mg | ORAL_CAPSULE | Freq: Every day | ORAL | Status: DC

## 2020-01-27 MED ORDER — DEXAMETHASONE SODIUM PHOSPHATE 10 MG/ML IJ SOLN
10.0000 mg | Freq: Once | INTRAMUSCULAR | Status: DC
  Filled 2020-01-27: qty 1

## 2020-01-27 MED ORDER — SODIUM CHLORIDE 0.9 % IV SOLN
INTRAVENOUS | Status: DC | PRN
  Administered 2020-01-27: 500 mL

## 2020-01-27 MED ORDER — PROPOFOL 10 MG/ML IV BOLUS
INTRAVENOUS | Status: AC
  Filled 2020-01-27: qty 40

## 2020-01-27 MED ORDER — LACTATED RINGERS IV SOLN: INTRAVENOUS | Status: DC

## 2020-01-27 MED ORDER — SENNA 8.6 MG PO TABS: 1.0000 | ORAL_TABLET | Freq: Two times a day (BID) | ORAL | Status: DC

## 2020-01-27 MED ORDER — ONDANSETRON HCL 4 MG PO TABS: 4.0000 mg | ORAL_TABLET | Freq: Four times a day (QID) | ORAL | Status: DC | PRN

## 2020-01-27 MED ORDER — PHENTERMINE HCL 37.5 MG PO TABS: 37.5000 mg | ORAL_TABLET | Freq: Every day | ORAL | Status: DC

## 2020-01-27 MED ORDER — LISINOPRIL-HYDROCHLOROTHIAZIDE 20-12.5 MG PO TABS: 1.0000 | ORAL_TABLET | Freq: Every day | ORAL | Status: DC

## 2020-01-27 MED ORDER — SODIUM CHLORIDE 0.9% FLUSH: 3.0000 mL | INTRAVENOUS | Status: DC | PRN

## 2020-01-27 MED ORDER — ROCURONIUM BROMIDE 10 MG/ML (PF) SYRINGE
PREFILLED_SYRINGE | INTRAVENOUS | Status: DC | PRN
  Administered 2020-01-27: 10 mg via INTRAVENOUS
  Administered 2020-01-27: 20 mg via INTRAVENOUS
  Administered 2020-01-27: 50 mg via INTRAVENOUS

## 2020-01-27 MED ORDER — PROPOFOL 10 MG/ML IV BOLUS
INTRAVENOUS | Status: DC | PRN
  Administered 2020-01-27: 150 mg via INTRAVENOUS

## 2020-01-27 MED ORDER — PHENOL 1.4 % MT LIQD: 1.0000 | OROMUCOSAL | Status: DC | PRN

## 2020-01-27 MED ORDER — FENTANYL CITRATE (PF) 250 MCG/5ML IJ SOLN
INTRAMUSCULAR | Status: AC
  Filled 2020-01-27: qty 5

## 2020-01-27 MED ORDER — CEFAZOLIN SODIUM-DEXTROSE 2-4 GM/100ML-% IV SOLN
2.0000 g | Freq: Three times a day (TID) | INTRAVENOUS | Status: DC
  Administered 2020-01-27: 2 g via INTRAVENOUS
  Filled 2020-01-27: qty 100

## 2020-01-27 MED ORDER — SODIUM CHLORIDE 0.9% FLUSH
3.0000 mL | Freq: Two times a day (BID) | INTRAVENOUS | Status: DC
  Administered 2020-01-27: 3 mL via INTRAVENOUS

## 2020-01-27 MED ORDER — CHLORHEXIDINE GLUCONATE 0.12 % MT SOLN
15.0000 mL | Freq: Once | OROMUCOSAL | Status: AC
  Administered 2020-01-27: 15 mL via OROMUCOSAL
  Filled 2020-01-27: qty 15

## 2020-01-27 MED ORDER — ONDANSETRON HCL 4 MG/2ML IJ SOLN: 4.0000 mg | Freq: Once | INTRAMUSCULAR | Status: DC | PRN

## 2020-01-27 MED ORDER — CEFAZOLIN SODIUM-DEXTROSE 2-4 GM/100ML-% IV SOLN
2.0000 g | INTRAVENOUS | Status: AC
  Administered 2020-01-27: 2 g via INTRAVENOUS
  Filled 2020-01-27: qty 100

## 2020-01-27 MED ORDER — DEXAMETHASONE 4 MG PO TABS
4.0000 mg | ORAL_TABLET | Freq: Four times a day (QID) | ORAL | Status: DC
  Administered 2020-01-27: 4 mg via ORAL
  Filled 2020-01-27: qty 1

## 2020-01-27 MED ORDER — THROMBIN 5000 UNITS EX SOLR
OROMUCOSAL | Status: DC | PRN
  Administered 2020-01-27: 5 mL via TOPICAL

## 2020-01-27 MED ORDER — MORPHINE SULFATE (PF) 2 MG/ML IV SOLN: 2.0000 mg | INTRAVENOUS | Status: DC | PRN

## 2020-01-27 MED ORDER — CELECOXIB 200 MG PO CAPS
200.0000 mg | ORAL_CAPSULE | Freq: Two times a day (BID) | ORAL | Status: DC
  Administered 2020-01-27: 200 mg via ORAL
  Filled 2020-01-27: qty 1

## 2020-01-27 MED ORDER — MIDAZOLAM HCL 2 MG/2ML IJ SOLN
INTRAMUSCULAR | Status: AC
  Filled 2020-01-27: qty 2

## 2020-01-27 MED ORDER — METHOCARBAMOL 500 MG PO TABS
500.0000 mg | ORAL_TABLET | Freq: Four times a day (QID) | ORAL | Status: DC | PRN
  Administered 2020-01-27: 500 mg via ORAL

## 2020-01-27 MED ORDER — BUPIVACAINE HCL (PF) 0.25 % IJ SOLN
INTRAMUSCULAR | Status: DC | PRN
  Administered 2020-01-27: 4 mL

## 2020-01-27 MED ORDER — ONDANSETRON HCL 4 MG/2ML IJ SOLN: 4.0000 mg | Freq: Four times a day (QID) | INTRAMUSCULAR | Status: DC | PRN

## 2020-01-27 MED ORDER — OXYCODONE HCL 5 MG PO TABS
10.0000 mg | ORAL_TABLET | ORAL | Status: DC | PRN
  Administered 2020-01-27: 10 mg via ORAL
  Filled 2020-01-27 (×2): qty 2

## 2020-01-27 MED ORDER — ACETAMINOPHEN 650 MG RE SUPP: 650.0000 mg | RECTAL | Status: DC | PRN

## 2020-01-27 MED ORDER — ONDANSETRON HCL 4 MG/2ML IJ SOLN
INTRAMUSCULAR | Status: DC | PRN
  Administered 2020-01-27: 4 mg via INTRAVENOUS

## 2020-01-27 MED ORDER — DEXAMETHASONE SODIUM PHOSPHATE 10 MG/ML IJ SOLN
INTRAMUSCULAR | Status: DC | PRN
  Administered 2020-01-27: 10 mg via INTRAVENOUS

## 2020-01-27 MED ORDER — HEPARIN SODIUM (PORCINE) 1000 UNIT/ML IJ SOLN
INTRAMUSCULAR | Status: AC
  Filled 2020-01-27: qty 1

## 2020-01-27 MED ORDER — THROMBIN 5000 UNITS EX SOLR
CUTANEOUS | Status: AC
  Filled 2020-01-27: qty 5000

## 2020-01-27 MED ORDER — AZELASTINE HCL 0.1 % NA SOLN
1.0000 | Freq: Every day | NASAL | Status: DC
  Filled 2020-01-27: qty 30

## 2020-01-27 MED ORDER — ASCORBIC ACID 500 MG PO TABS
250.0000 mg | ORAL_TABLET | Freq: Every day | ORAL | Status: DC
  Administered 2020-01-27: 250 mg via ORAL
  Filled 2020-01-27: qty 1

## 2020-01-27 MED ORDER — HYDROMORPHONE HCL 1 MG/ML IJ SOLN
INTRAMUSCULAR | Status: AC
  Filled 2020-01-27: qty 1

## 2020-01-27 MED ORDER — LISINOPRIL 20 MG PO TABS: 20.0000 mg | ORAL_TABLET | Freq: Every day | ORAL | Status: DC

## 2020-01-27 MED ORDER — POTASSIUM CHLORIDE IN NACL 20-0.9 MEQ/L-% IV SOLN: INTRAVENOUS | Status: DC

## 2020-01-27 MED ORDER — SODIUM CHLORIDE 0.9 % IV SOLN
250.0000 mL | INTRAVENOUS | Status: DC
  Administered 2020-01-27: 250 mL via INTRAVENOUS

## 2020-01-27 MED ORDER — DEXAMETHASONE SODIUM PHOSPHATE 4 MG/ML IJ SOLN: 4.0000 mg | Freq: Four times a day (QID) | INTRAMUSCULAR | Status: DC

## 2020-01-27 MED ORDER — MIDAZOLAM HCL 5 MG/5ML IJ SOLN
INTRAMUSCULAR | Status: DC | PRN
  Administered 2020-01-27 (×2): 1 mg via INTRAVENOUS

## 2020-01-27 MED ORDER — LIDOCAINE 2% (20 MG/ML) 5 ML SYRINGE
INTRAMUSCULAR | Status: DC | PRN
  Administered 2020-01-27: 40 mg via INTRAVENOUS

## 2020-01-27 MED ORDER — HEPARIN SODIUM (PORCINE) 1000 UNIT/ML IJ SOLN
INTRAMUSCULAR | Status: DC | PRN
  Administered 2020-01-27: 5000 [IU]

## 2020-01-27 MED ORDER — METHOCARBAMOL 500 MG PO TABS
ORAL_TABLET | ORAL | Status: AC
  Filled 2020-01-27: qty 1

## 2020-01-27 MED ORDER — THROMBIN 20000 UNITS EX SOLR
CUTANEOUS | Status: AC
  Filled 2020-01-27: qty 20000

## 2020-01-27 MED ORDER — HYDROMORPHONE HCL 1 MG/ML IJ SOLN
0.2500 mg | INTRAMUSCULAR | Status: DC | PRN
  Administered 2020-01-27: 0.25 mg via INTRAVENOUS
  Administered 2020-01-27: 0.5 mg via INTRAVENOUS
  Administered 2020-01-27: 0.25 mg via INTRAVENOUS

## 2020-01-27 MED ORDER — FENTANYL CITRATE (PF) 100 MCG/2ML IJ SOLN
INTRAMUSCULAR | Status: DC | PRN
  Administered 2020-01-27 (×4): 50 ug via INTRAVENOUS

## 2020-01-27 MED ORDER — ACETAMINOPHEN 325 MG PO TABS: 650.0000 mg | ORAL_TABLET | ORAL | Status: DC | PRN

## 2020-01-27 MED ORDER — PHENYLEPHRINE HCL-NACL 10-0.9 MG/250ML-% IV SOLN
INTRAVENOUS | Status: DC | PRN
  Administered 2020-01-27: 50 ug/min via INTRAVENOUS

## 2020-01-27 MED ORDER — ORAL CARE MOUTH RINSE: 15.0000 mL | Freq: Once | OROMUCOSAL | Status: AC

## 2020-01-27 MED ORDER — METHOCARBAMOL 1000 MG/10ML IJ SOLN
500.0000 mg | Freq: Four times a day (QID) | INTRAVENOUS | Status: DC | PRN
  Filled 2020-01-27: qty 5

## 2020-01-27 MED ORDER — MENTHOL 3 MG MT LOZG: 1.0000 | LOZENGE | OROMUCOSAL | Status: DC | PRN

## 2020-01-27 MED ORDER — THROMBIN 20000 UNITS EX SOLR
CUTANEOUS | Status: DC | PRN
  Administered 2020-01-27: 20 mL via TOPICAL

## 2020-01-27 MED ORDER — SUGAMMADEX SODIUM 200 MG/2ML IV SOLN
INTRAVENOUS | Status: DC | PRN
  Administered 2020-01-27: 200 mg via INTRAVENOUS

## 2020-01-27 MED ORDER — ARTHREX ANGEL - ACD-A SOLUTION (CHARTING ONLY) OPTIME
TOPICAL | Status: DC | PRN
  Administered 2020-01-27: 10 mL via TOPICAL

## 2020-01-27 MED ORDER — BUPIVACAINE HCL (PF) 0.25 % IJ SOLN
INTRAMUSCULAR | Status: AC
  Filled 2020-01-27: qty 30

## 2020-01-27 SURGICAL SUPPLY — 61 items
BAG DECANTER FOR FLEXI CONT (MISCELLANEOUS) ×2 IMPLANT
BASKET BONE COLLECTION (BASKET) ×2 IMPLANT
BENZOIN TINCTURE PRP APPL 2/3 (GAUZE/BANDAGES/DRESSINGS) ×2 IMPLANT
BLADE CLIPPER SURG (BLADE) IMPLANT
BUR CARBIDE MATCH 3.0 (BURR) ×2 IMPLANT
CANISTER SUCT 3000ML PPV (MISCELLANEOUS) ×2 IMPLANT
CNTNR URN SCR LID CUP LEK RST (MISCELLANEOUS) ×1 IMPLANT
CONT SPEC 4OZ STRL OR WHT (MISCELLANEOUS) ×2
COVER BACK TABLE 60X90IN (DRAPES) ×2 IMPLANT
COVER WAND RF STERILE (DRAPES) ×2 IMPLANT
DERMABOND ADVANCED (GAUZE/BANDAGES/DRESSINGS) ×1
DERMABOND ADVANCED .7 DNX12 (GAUZE/BANDAGES/DRESSINGS) ×1 IMPLANT
DIFFUSER DRILL AIR PNEUMATIC (MISCELLANEOUS) ×2 IMPLANT
DRAPE C-ARM 42X72 X-RAY (DRAPES) ×2 IMPLANT
DRAPE C-ARMOR (DRAPES) ×2 IMPLANT
DRAPE LAPAROTOMY 100X72X124 (DRAPES) ×2 IMPLANT
DRAPE SURG 17X23 STRL (DRAPES) ×2 IMPLANT
DRSG OPSITE POSTOP 4X6 (GAUZE/BANDAGES/DRESSINGS) ×2 IMPLANT
DURAPREP 26ML APPLICATOR (WOUND CARE) ×2 IMPLANT
ELECT REM PT RETURN 9FT ADLT (ELECTROSURGICAL) ×2
ELECTRODE REM PT RTRN 9FT ADLT (ELECTROSURGICAL) ×1 IMPLANT
EVACUATOR 1/8 PVC DRAIN (DRAIN) IMPLANT
GAUZE 4X4 16PLY RFD (DISPOSABLE) IMPLANT
GLOVE BIO SURGEON STRL SZ7 (GLOVE) IMPLANT
GLOVE BIO SURGEON STRL SZ8 (GLOVE) ×4 IMPLANT
GLOVE BIOGEL PI IND STRL 7.0 (GLOVE) ×1 IMPLANT
GLOVE BIOGEL PI IND STRL 8 (GLOVE) ×2 IMPLANT
GLOVE BIOGEL PI INDICATOR 7.0 (GLOVE) ×1
GLOVE BIOGEL PI INDICATOR 8 (GLOVE) ×2
GLOVE ECLIPSE 6.5 STRL STRAW (GLOVE) ×2 IMPLANT
GOWN STRL REUS W/ TWL LRG LVL3 (GOWN DISPOSABLE) ×2 IMPLANT
GOWN STRL REUS W/ TWL XL LVL3 (GOWN DISPOSABLE) ×4 IMPLANT
GOWN STRL REUS W/TWL 2XL LVL3 (GOWN DISPOSABLE) IMPLANT
GOWN STRL REUS W/TWL LRG LVL3 (GOWN DISPOSABLE) ×4
GOWN STRL REUS W/TWL XL LVL3 (GOWN DISPOSABLE) ×8
HEMOSTAT POWDER KIT SURGIFOAM (HEMOSTASIS) ×2 IMPLANT
KIT BASIN OR (CUSTOM PROCEDURE TRAY) ×2 IMPLANT
KIT BONE MRW ASP ANGEL CPRP (KITS) ×2 IMPLANT
KIT TURNOVER KIT B (KITS) ×2 IMPLANT
NEEDLE HYPO 25X1 1.5 SAFETY (NEEDLE) ×2 IMPLANT
NS IRRIG 1000ML POUR BTL (IV SOLUTION) ×2 IMPLANT
PACK LAMINECTOMY NEURO (CUSTOM PROCEDURE TRAY) ×2 IMPLANT
PAD ARMBOARD 7.5X6 YLW CONV (MISCELLANEOUS) ×4 IMPLANT
PUTTY DBM ALLOSYNC PURE 10CC (Putty) ×2 IMPLANT
ROD LORD LIPPED TI 5.5X40 (Rod) ×4 IMPLANT
SCREW CORT SHANK MOD 6.5X40 (Screw) ×8 IMPLANT
SCREW POLYAXIAL TULIP (Screw) ×8 IMPLANT
SET SCREW (Screw) ×8 IMPLANT
SET SCREW SPNE (Screw) ×4 IMPLANT
SPACER IDENTITI 10X10X30 15D (Spacer) ×2 IMPLANT
SPONGE LAP 4X18 RFD (DISPOSABLE) IMPLANT
SPONGE SURGIFOAM ABS GEL 100 (HEMOSTASIS) ×2 IMPLANT
STRIP CLOSURE SKIN 1/2X4 (GAUZE/BANDAGES/DRESSINGS) ×2 IMPLANT
SUT VIC AB 0 CT1 18XCR BRD8 (SUTURE) ×1 IMPLANT
SUT VIC AB 0 CT1 8-18 (SUTURE) ×2
SUT VIC AB 2-0 CP2 18 (SUTURE) ×2 IMPLANT
SUT VIC AB 3-0 SH 8-18 (SUTURE) ×4 IMPLANT
TOWEL GREEN STERILE (TOWEL DISPOSABLE) ×2 IMPLANT
TOWEL GREEN STERILE FF (TOWEL DISPOSABLE) ×2 IMPLANT
TRAY FOLEY MTR SLVR 16FR STAT (SET/KITS/TRAYS/PACK) ×2 IMPLANT
WATER STERILE IRR 1000ML POUR (IV SOLUTION) ×2 IMPLANT

## 2020-01-27 NOTE — Plan of Care (Signed)
Patient alert and oriented, mae's well, voiding adequate amount of urine, swallowing without difficulty, no c/o pain at time of discharge. Patient discharged home with family. Script and discharged instructions given to patient. Patient and family stated understanding of instructions given. Patient has an appointment with Dr. Jones °

## 2020-01-27 NOTE — Evaluation (Addendum)
I agree with the following treatment note after review of the documentation. This session was performed under the supervision of a licensed clinician.   Cindee Salt, DPT  Acute Rehabilitation Services  Pager: [phone]  Physical Therapy Evaluation Patient Details Name: Terri Campbell MRN: [id number] DOB: 1965-11-10 Today's Date: 01/27/2020   History of Present Illness  Pt is a 54 yo female who is s/p TLIF L4-5 and synovial cyst removal. Pt had a history of low back surgery and past medical history of HTN and Anxiety   Clinical Impression  Pt was evaluated for the above diagnosis and impairments below. Pt required min guard assist for all mobility tasks assessed. Pt utilized RW with transfers and ambulation. She was educated on spinal precautions and general walking program. She lives at home with her mom who can assist 24/7. Pt would continue to benefit from acute therapy in order to return her to PLOF. Will continue to follow acutely.     Follow Up Recommendations No PT follow up (vs HHPT pending pt progression)    Equipment Recommendations  Rolling walker with 5" wheels    Recommendations for Other Services       Precautions / Restrictions Precautions Precautions: Back Precaution Booklet Issued: Yes (comment) Precaution Comments: pt educated on spinal precautions Required Braces or Orthoses: Spinal Brace Spinal Brace: Lumbar corset;Applied in sitting position Restrictions Weight Bearing Restrictions: No      Mobility  Bed Mobility Overal bed mobility: Needs Assistance Bed Mobility: Rolling;Sidelying to Sit;Sit to Sidelying Rolling: Min guard Sidelying to sit: Min guard;HOB elevated     Sit to sidelying: Min guard;HOB elevated General bed mobility comments: required min gaurd assist with HOB elevated >40 degrees and cueing for precautions. Pt utilized bedrailing   Transfers Overall transfer level: Needs assistance Equipment used: Rolling walker (2  wheeled) Transfers: Sit to/from Stand Sit to Stand: Min guard         General transfer comment: required min guard assist for transfer. Cues for safe hand placement.   Ambulation/Gait Ambulation/Gait assistance: Min guard Gait Distance (Feet): 175 Feet Assistive device: Rolling walker (2 wheeled) Gait Pattern/deviations: Step-through pattern;Decreased stride length;Decreased step length - right;Decreased step length - left Gait velocity: decreased   General Gait Details: pt slow, steady and very guarded gait. She required min guard assist with a RW for ambulation.   Stairs            Wheelchair Mobility    Modified Rankin (Stroke Patients Only)       Balance Overall balance assessment: Needs assistance Sitting-balance support: No upper extremity supported;Feet supported Sitting balance-Leahy Scale: Fair Sitting balance - Comments: pt able to sit EOB without UE support   Standing balance support: Bilateral upper extremity supported Standing balance-Leahy Scale: Poor Standing balance comment: Required UE support             Pertinent Vitals/Pain Pain Assessment: 0-10 Pain Score: 4  Pain Location: low back Pain Descriptors / Indicators: Sore;Operative site guarding;Guarding Pain Intervention(s): Limited activity within patient's tolerance;Monitored during session;Repositioned    Home Living Family/patient expects to be discharged to:: Private residence Living Arrangements: Parent Available Help at Discharge: Family;Available 24 hours/day Type of Home: Apartment Home Access: Stairs to enter Entrance Stairs-Rails: Lawyer of Steps: 14 Home Layout: One level Home Equipment: None Additional Comments: pt lives at home with her mom    Prior Function Level of Independence: Independent         Comments: pt doesn't work. mom can  provide assistance if needed      Hand Dominance   Dominant Hand: Right    Extremity/Trunk  Assessment   Upper Extremity Assessment Upper Extremity Assessment: Defer to OT evaluation    Lower Extremity Assessment Lower Extremity Assessment: Generalized weakness    Cervical / Trunk Assessment Cervical / Trunk Assessment: Normal  Communication   Communication: No difficulties  Cognition Arousal/Alertness: Awake/alert Behavior During Therapy: Anxious Overall Cognitive Status: Within Functional Limits for tasks assessed         General Comments General comments (skin integrity, edema, etc.): pt was educated on general walking program    Exercises     Assessment/Plan    PT Assessment Patient needs continued PT services  PT Problem List Decreased knowledge of precautions;Pain;Decreased balance;Decreased knowledge of use of DME;Decreased mobility;Decreased strength       PT Treatment Interventions DME instruction;Gait training;Stair training;Therapeutic activities;Functional mobility training;Therapeutic exercise;Balance training    PT Goals (Current goals can be found in the Care Plan section)  Acute Rehab PT Goals Patient Stated Goal: none stated PT Goal Formulation: With patient Time For Goal Achievement: 02/10/20 Potential to Achieve Goals: Good    Frequency Min 5X/week   Barriers to discharge        Co-evaluation               AM-PAC PT "6 Clicks" Mobility  Outcome Measure Help needed turning from your back to your side while in a flat bed without using bedrails?: A Little Help needed moving from lying on your back to sitting on the side of a flat bed without using bedrails?: A Little Help needed moving to and from a bed to a chair (including a wheelchair)?: A Little Help needed standing up from a chair using your arms (e.g., wheelchair or bedside chair)?: A Little Help needed to walk in hospital room?: A Little Help needed climbing 3-5 steps with a railing? : A Little 6 Click Score: 18    End of Session Equipment Utilized During Treatment:  Gait belt;Back brace Activity Tolerance: Patient tolerated treatment well Patient left: in bed;with call bell/phone within reach Nurse Communication: Mobility status PT Visit Diagnosis: Pain Pain - part of body:  (low back)    Time: 2202-5427 PT Time Calculation (min) (ACUTE ONLY): 24 min   Charges:   PT Evaluation $PT Eval Low Complexity: 1 Low PT Treatments $Gait Training: 8-22 mins       Harmon Pier, SPT  Acute Rehabilitation Services  Office: [phone]  01/27/2020, 3:18 PM

## 2020-01-27 NOTE — H&P (Signed)
Subjective: Patient is a 54 y.o. female admitted for tlif. Onset of symptoms was several months ago, gradually worsening since that time.  The pain is rated severe, and is located at the across the lower back and radiates to RLE. The pain is described as aching and occurs all day. The symptoms have been progressive. Symptoms are exacerbated by coughing and exercise. MRI or CT showed synovial cyst with spondy L4-5   Past Medical History:  Diagnosis Date  . Anxiety   . CIN I (cervical intraepithelial neoplasia I)   . Complication of anesthesia    "my hair fell out  . Elevated cholesterol   . Hypertension     Past Surgical History:  Procedure Laterality Date  . APPENDECTOMY    . BACK SURGERY    . COLPOSCOPY  1992  . LAPAROSCOPIC APPENDECTOMY N/A 11/11/2013   Procedure: APPENDECTOMY LAPAROSCOPIC;  Surgeon: Valarie Merino, MD;  Location: [facility];  Service: General;  Laterality: N/A;  . NECK SURGERY     disc    Prior to Admission medications   Medication Sig Start Date End Date Taking? Authorizing Provider  azelastine (ASTELIN) 0.1 % nasal spray Place 1 spray into both nostrils at bedtime. Use in each nostril as directed    Yes [provider]  cetirizine (ZYRTEC) 10 MG tablet Take 10 mg by mouth daily.   Yes [provider]  cholecalciferol (VITAMIN D) 25 MCG (1000 UNIT) tablet Take 1,000 Units by mouth daily.   Yes [provider]  Cranberry 300 MG tablet Take 300 mg by mouth 2 (two) times daily.   Yes [provider]  ibuprofen (ADVIL) 200 MG tablet Take 200 mg by mouth every 8 (eight) hours as needed (pain.).   Yes [provider]  L-Lysine 1000 MG TABS Take 1,000 mg by mouth daily.   Yes [provider]  Levonorgestrel (SKYLA) 13.5 MG IUD 1 each by Intrauterine route once.    Yes [provider]  lisinopril-hydrochlorothiazide (PRINZIDE,ZESTORETIC) 20-12.5 MG per tablet Take 1 tablet by mouth daily.     Yes [provider]  Multiple Vitamin (MULTIVITAMIN WITH MINERALS) TABS tablet Take 1 tablet by mouth daily.   Yes [provider]  Omega-3 Fatty Acids (FISH OIL PO) Take 900 mg by mouth in the morning and at bedtime.   Yes [provider]  phentermine (ADIPEX-P) 37.5 MG tablet Take 37.5 mg by mouth daily before breakfast.  12/24/19  Yes [provider]  pravastatin (PRAVACHOL) 40 MG tablet Take 40 mg by mouth every evening.    Yes [provider]  Probiotic Product (PROBIOTIC PO) Take 1 capsule by mouth daily.    Yes [provider]  pseudoephedrine (SUDAFED) 120 MG 12 hr tablet Take 120 mg by mouth daily.   Yes [provider]  vitamin C (ASCORBIC ACID) 250 MG tablet Take 250 mg by mouth daily.    Yes [provider]   Allergies  Allergen Reactions  . Dipth, Acell Pertus, [Tetanus-Diphth-Acell Pertussis] Other (See Comments)    Boil formed/skin hardens   . Nitrofurantoin Monohyd Macro Swelling    MACROBID  . Vicodin [Hydrocodone-Acetaminophen] Other (See Comments)    TUNNEL VISION    Social History   Tobacco Use  . Smoking status: Never Smoker  . Smokeless tobacco: Never Used  Substance Use Topics  . Alcohol use: Yes    Alcohol/week: 0.5 standard drinks    Types: 1 Standard drinks or equivalent per week  Comment: occ    Family History  Problem Relation Age of Onset  . Hypertension Mother   . Breast cancer Maternal Grandmother      Review of Systems  Positive ROS: neg  All other systems have been reviewed and were otherwise negative with the exception of those mentioned in the HPI and as above.  Objective: Vital signs in last 24 hours: Temp:  [97.9 F (36.6 C)] 97.9 F (36.6 C) (09/01 0703) Pulse Rate:  [83] 83 (09/01 0703) Resp:  [19] 19 (09/01 0703) BP: (142)/(68) 142/68 (09/01 0703) SpO2:  [98 %] 98 % (09/01 0703) Weight:  [103.4 kg] 103.4 kg (09/01 0703)  General Appearance: Alert, cooperative, no  distress, appears stated age Head: Normocephalic, without obvious abnormality, atraumatic Eyes: PERRL, conjunctiva/corneas clear, EOM's intact    Neck: Supple, symmetrical, trachea midline Back: Symmetric, no curvature, ROM normal, no CVA tenderness Lungs:  respirations unlabored Heart: Regular rate and rhythm Abdomen: Soft, non-tender Extremities: Extremities normal, atraumatic, no cyanosis or edema Pulses: 2+ and symmetric all extremities Skin: Skin color, texture, turgor normal, no rashes or lesions  NEUROLOGIC:   Mental status: Alert and oriented x4,  no aphasia, good attention span, fund of knowledge, and memory Motor Exam - grossly normal Sensory Exam - grossly normal Reflexes: 1+ Coordination - grossly normal Gait - grossly normal Balance - grossly normal Cranial Nerves: I: smell Not tested  II: visual acuity  OS: nl    OD: nl  II: visual fields Full to confrontation  II: pupils Equal, round, reactive to light  III,VII: ptosis None  III,IV,VI: extraocular muscles  Full ROM  V: mastication Normal  V: facial light touch sensation  Normal  V,VII: corneal reflex  Present  VII: facial muscle function - upper  Normal  VII: facial muscle function - lower Normal  VIII: hearing Not tested  IX: soft palate elevation  Normal  IX,X: gag reflex Present  XI: trapezius strength  5/5  XI: sternocleidomastoid strength 5/5  XI: neck flexion strength  5/5  XII: tongue strength  Normal    Data Review Lab Results  Component Value Date   WBC 11.1 (H) 01/21/2020   HGB 15.4 (H) 01/21/2020   HCT 46.1 (H) 01/21/2020   MCV 90.2 01/21/2020   PLT 485 (H) 01/21/2020   Lab Results  Component Value Date   NA 137 01/21/2020   K 3.4 (L) 01/21/2020   CL 100 01/21/2020   CO2 28 01/21/2020   BUN 12 01/21/2020   CREATININE 0.91 01/21/2020   GLUCOSE 86 01/21/2020   Lab Results  Component Value Date   INR 1.1 01/21/2020    Assessment/Plan:  Estimated body mass index is 37.94 kg/m  as calculated from the following:   Height as of this encounter: 5\' 5"  (1.651 m).   Weight as of this encounter: 103.4 kg. Patient admitted for TLIF L4-5. Patient has failed a reasonable attempt at conservative therapy.  I explained the condition and procedure to the patient and answered any questions.  Patient wishes to proceed with procedure as planned. Understands risks/ benefits and typical outcomes of procedure.   01/27/2020 8:24 AM

## 2020-01-27 NOTE — Anesthesia Postprocedure Evaluation (Signed)
Anesthesia Post Note  Patient: Terri Campbell  Procedure(s) Performed: Transforaminal Lumbar Interbody Fusion - right - Lumbar four-Lumbar five (Right Back)     Patient location during evaluation: PACU Anesthesia Type: General Level of consciousness: awake and alert Pain management: pain level controlled Vital Signs Assessment: post-procedure vital signs reviewed and stable Respiratory status: spontaneous breathing, nonlabored ventilation, respiratory function stable and patient connected to nasal cannula oxygen Cardiovascular status: blood pressure returned to baseline and stable Postop Assessment: no apparent nausea or vomiting Anesthetic complications: no   No complications documented.  Last Vitals:  Vitals:   01/27/20 1205 01/27/20 1229  BP: 108/68 108/67  Pulse: 77 64  Resp: 16 16  Temp: 36.7 C 36.7 C  SpO2: 95% 95%    Last Pain:  Vitals:   01/27/20 1229  TempSrc: Oral  PainSc:                  [name] DAVID

## 2020-01-27 NOTE — Anesthesia Procedure Notes (Signed)
Procedure Name: Intubation Date/Time: 01/27/2020 8:52 AM Performed by: Elliot Dally, CRNA Pre-anesthesia Checklist: Patient identified, Emergency Drugs available, Suction available and Patient being monitored Patient Re-evaluated:Patient Re-evaluated prior to induction Oxygen Delivery Method: Circle System Utilized Preoxygenation: Pre-oxygenation with 100% oxygen Induction Type: IV induction Ventilation: Mask ventilation without difficulty and Oral airway inserted - appropriate to patient size Laryngoscope Size: Hyacinth Meeker and 2 Grade View: Grade I Tube type: Oral Tube size: 7.0 mm Number of attempts: 1 Airway Equipment and Method: Stylet and Oral airway Placement Confirmation: ETT inserted through vocal cords under direct vision,  positive ETCO2 and breath sounds checked- equal and bilateral Secured at: 21 cm Tube secured with: Tape Dental Injury: Teeth and Oropharynx as per pre-operative assessment

## 2020-01-27 NOTE — Op Note (Signed)
01/27/2020  11:04 AM  PATIENT:  Terri Campbell  54 y.o. female  PRE-OPERATIVE DIAGNOSIS: Postlaminectomy spondylolisthesis L4-5, right L4-5 synovial cyst, facet arthropathy, spinal stenosis, back and right leg pain  POST-OPERATIVE DIAGNOSIS:  same  PROCEDURE:   1. Decompressive lumbar laminectomy medial facetectomy foraminotomies with resection of a large synovial cyst requiring more work than would be required for a simple exposure of the disk for TLIF in order to adequately decompress the neural elements and address the spinal stenosis 2.  Transforaminal lumbar interbody fusion L4-5 from the right using porous titanium interbody cage packed with morcellized allograft and autograft soaked with a bone marrow aspirate obtained through a separate fascial incision over the right iliac crest 3. Posterior fixation L45 using Alphatec cortical pedicle screws.  4. Intertransverse arthrodesis L4-5 using morcellized autograft and allograft.  SURGEON:  Marikay Alar, MD  ASSISTANTS: Dr. Coletta Memos  ANESTHESIA:  General  EBL: 200 ml  Total I/O In: 1000 [I.V.:1000] Out: 290 [Urine:90; Blood:200]  BLOOD ADMINISTERED:none  DRAINS: none   INDICATION FOR PROCEDURE: This patient presented with severe right leg pain in L5 distribution. Imaging revealed postlaminectomy spondylolisthesis at L4-5 with a large right-sided synovial cyst compressing the right L5 nerve root. The patient tried a reasonable attempt at conservative medical measures without relief. I recommended decompression and instrumented fusion to address the stenosis as well as the segmental  instability.  Patient understood the risks, benefits, and alternatives and potential outcomes and wished to proceed.  PROCEDURE DETAILS:  The patient was brought to the operating room. After induction of generalized endotracheal anesthesia the patient was rolled into the prone position on chest rolls and all pressure points were padded. The patient's  lumbar region was cleaned and then prepped with DuraPrep and draped in the usual sterile fashion. Anesthesia was injected and then a dorsal midline incision was made and carried down to the lumbosacral fascia. The fascia was opened and the paraspinous musculature was taken down in a subperiosteal fashion to expose L4-5 bilaterally. A self-retaining retractor was placed. Intraoperative fluoroscopy confirmed my level, and I started with placement of the L4 cortical pedicle screws. The pedicle screw entry zones were identified utilizing surface landmarks and  AP and lateral fluoroscopy. I scored the cortex with the high-speed drill and then used the hand drill to drill an upward and outward direction into the pedicle. I then tapped line to line. I then placed a 6.5 x 40 mm cortical pedicle screw into the pedicles of L4 bilaterally.  I then dissected in a suprafascial plane to expose the iliac crest.  Opened the fascia and we used a Jamshidi needle to extract 60 cc of bone marrow aspirate from the iliac crest with the assistance of my nurse practitioner.  This was then spun down by [facility] device and 2 to 4 cc of  BMAC was soaked on morselized allograft for later arthrodesis.  I dried the hole with Surgifoam and closed the fascia.  I then turned my attention to the decompression and a complete lumbar laminectomY, hemi- facetectomY, and foraminotomy was performed at L4-5 on the right.  My nurse practitioner was directly involved in the decompression and exposure of the neural elements.  There was a large synovial cyst at L4-5 on the right.  We teased this away from the dura and removed it mostly en bloc.  The patient had significant spinal stenosis and this required more work than would be required for a simple exposure of the disc for  lumbar  interbody fusion which would only require a limited laminotomy. Much more generous decompression and generous foraminotomy was undertaken in order to adequately decompress the neural  elements and address the patient's leg pain. The yellow ligament was removed to expose the underlying dura and nerve root, and generous foraminotomy was performed to adequately decompress the neural elements. Both the exiting and traversing nerve roots were decompressed until a coronary dilator passed easily along the nerve roots. Once the decompression was complete, I turned my attention to the transforaminal lumbar interbody fusion. The epidural venous vasculature was coagulated and cut sharply. Disc space was incised and the initial discectomy was performed with pituitary rongeurs. The disc space was distracted with sequential distractors to a height of 10 mm. We then used a series of scrapers and shavers to prepare the endplates for fusion. The midline was prepared with Epstein curettes. Once the complete discectomy was finished, we packed an appropriate sized interbody cage with local autograft and morcellized allograft, gently retracted the nerve root, and tapped the cage into position at L4-5 from the right.  The space in front of the cage and posterior to the cage  was packed with morselized autograft and allograft. We then turned our attention to the placement of the lower pedicle screws. The pedicle screw entry zones were identified utilizing surface landmarks and fluoroscopy. I drilled into each pedicle utilizing the hand drill, and tapped each pedicle with the appropriate tap. We palpated with a ball probe to assure no break in the cortex. We then placed 6.5 x 40 mm cortical pedicle screws into the pedicles bilaterally at L5.  My nurse practitioner assisted in placement of the pedicle screws.  We then decorticated the transverse processes and laid a mixture of morcellized autograft and allograft out over these to perform intertransverse arthrodesis at L4-5. We then placed lordotic rods into the multiaxial screw heads of the pedicle screws and achieved compression of our graft and locked these in position  with the locking caps and anti-torque device. We then checked our construct with AP and lateral fluoroscopy. Irrigated with copious amounts of bacitracin-containing saline solution. Inspected the nerve roots once again to assure adequate decompression, lined to the dura with Gelfoam, placed powdered vancomycin into the wound, and then we closed the muscle and the fascia with 0 Vicryl. Closed the subcutaneous tissues with 2-0 Vicryl and subcuticular tissues with 3-0 Vicryl. The skin was closed with benzoin and Steri-Strips. Dressing was then applied, the patient was awakened from general anesthesia and transported to the recovery room in stable condition. At the end of the procedure all sponge, needle and instrument counts were correct.   PLAN OF CARE: admit to inpatient  PATIENT DISPOSITION:  PACU - hemodynamically stable.   Delay start of Pharmacological VTE agent (>24hrs) due to surgical blood loss or risk of bleeding:  yes

## 2020-01-27 NOTE — Discharge Summary (Signed)
Physician Discharge Summary  Patient ID: Terri Campbell MRN: [id number] DOB/AGE: Aug 27, 1965 54 y.o.  Admit date: 01/27/2020 Discharge date: 01/27/2020  Admission Diagnoses: Postlaminectomy spondylolisthesis L4-5, right L4-5 synovial cyst, facet arthropathy, spinal stenosis, back and right leg pain   Discharge Diagnoses: sam   Discharged Condition: good  Hospital Course: The patient was admitted on 01/27/2020 and taken to the operating room where the patient underwent TLIF L4-5. The patient tolerated the procedure well and was taken to the recovery room and then to the floor in stable condition. The hospital course was routine. There were no complications. The wound remained clean dry and intact. Pt had appropriate back soreness. No complaints of leg pain or new N/T/W. The patient remained afebrile with stable vital signs, and tolerated a regular diet. The patient continued to increase activities, and pain was well controlled with oral pain medications.   Consults: None  Significant Diagnostic Studies:  Results for orders placed or performed during the hospital encounter of 01/27/20  Pregnancy, urine POC  Result Value Ref Range   Preg Test, Ur NEGATIVE NEGATIVE  ABO/Rh  Result Value Ref Range   ABO/RH(D)      AB POS Performed at [facility], [address] [place], [place] [address]     DG Chest 2 View  Result Date: 01/27/2020 CLINICAL DATA:  Possible left hilar abnormality on prior chest film. EXAM: CHEST - 2 VIEW COMPARISON:  PA and lateral chest 01/21/2020. FINDINGS: The lungs are clear. Heart size is normal. There is no pneumothorax or pleural fluid. No acute or focal bony abnormality. IMPRESSION: Negative chest. Electronically Signed   By: Drusilla Kanner M.D.   On: 01/27/2020 15:40   Chest 2 View  Result Date: 01/21/2020 CLINICAL DATA:  Preoperative study. EXAM: CHEST - 2 VIEW COMPARISON:  Prior report 02/23/2000. FINDINGS: Mediastinum is unremarkable. Heart size normal.  Low lung volumes with mild bibasilar atelectasis. Mild left hilar prominence possibly related to low lung volumes and mild atelectasis. Repeat PA and lateral chest x-ray with deep inspiration suggested. No pleural effusion or pneumothorax. Degenerative change thoracic spine. IMPRESSION: Mild left hilar prominence, possibly related to low lung volumes and mild atelectasis. Repeat PA and lateral chest x-ray with deep inspiration suggested. Electronically Signed   By: Maisie Fus  Register   On: 01/21/2020 16:35   DG Lumbar Spine 2-3 Views  Result Date: 01/27/2020 CLINICAL DATA:  Surgical posterior fusion of L4-5. EXAM: LUMBAR SPINE - 2-3 VIEW; DG C-ARM 1-60 MIN Radiation exposure index: 51.33 mGy. COMPARISON:  February 18, 2019. FINDINGS: Two intraoperative fluoroscopic images were obtained of the lower lumbar spine. These images demonstrate the patient be status post surgical posterior fusion of L4-5 with bilateral intrapedicular screw placement and interbody fusion. Good alignment of vertebral bodies is noted. IMPRESSION: Status post surgical posterior fusion of L4-5. Electronically Signed   By: Lupita Raider M.D.   On: 01/27/2020 11:05   DG C-Arm 1-60 Min  Result Date: 01/27/2020 CLINICAL DATA:  Surgical posterior fusion of L4-5. EXAM: LUMBAR SPINE - 2-3 VIEW; DG C-ARM 1-60 MIN Radiation exposure index: 51.33 mGy. COMPARISON:  February 18, 2019. FINDINGS: Two intraoperative fluoroscopic images were obtained of the lower lumbar spine. These images demonstrate the patient be status post surgical posterior fusion of L4-5 with bilateral intrapedicular screw placement and interbody fusion. Good alignment of vertebral bodies is noted. IMPRESSION: Status post surgical posterior fusion of L4-5. Electronically Signed   By: Lupita Raider M.D.   On: 01/27/2020  11:05    Antibiotics:  Anti-infectives (From admission, onward)   Start     Dose/Rate Route Frequency Ordered Stop   01/27/20 1700  ceFAZolin (ANCEF) IVPB  2g/100 mL premix        2 g 200 mL/hr over 30 Minutes Intravenous Every 8 hours 01/27/20 1223 01/28/20 0859   01/27/20 0845  bacitracin 50,000 Units in sodium chloride 0.9 % 500 mL irrigation  Status:  Discontinued          As needed 01/27/20 0938 01/27/20 1113   01/27/20 0700  ceFAZolin (ANCEF) IVPB 2g/100 mL premix        2 g 200 mL/hr over 30 Minutes Intravenous On call to O.R. 01/27/20 0647 01/27/20 0900      Discharge Exam: Blood pressure 123/66, pulse 82, temperature 97.6 F (36.4 C), temperature source Oral, resp. rate 17, height 5\' 5"  (1.651 m), weight 103.4 kg, SpO2 98 %. Neurologic: Grossly normal Ambulating and voiding well, incisicion cdi  Discharge Medications:   Allergies as of 01/27/2020      Reactions   Dipth, Acell Pertus, [tetanus-diphth-acell Pertussis] Other (See Comments)   Boil formed/skin hardens    Nitrofurantoin Monohyd Macro Swelling   MACROBID   Vicodin [hydrocodone-acetaminophen] Other (See Comments)   TUNNEL VISION      Medication List    TAKE these medications   azelastine 0.1 % nasal spray Commonly known as: ASTELIN Place 1 spray into both nostrils at bedtime. Use in each nostril as directed   cetirizine 10 MG tablet Commonly known as: ZYRTEC Take 10 mg by mouth daily.   cholecalciferol 25 MCG (1000 UNIT) tablet Commonly known as: VITAMIN D Take 1,000 Units by mouth daily.   Cranberry 300 MG tablet Take 300 mg by mouth 2 (two) times daily.   FISH OIL PO Take 900 mg by mouth in the morning and at bedtime.   ibuprofen 200 MG tablet Commonly known as: ADVIL Take 200 mg by mouth every 8 (eight) hours as needed (pain.).   L-Lysine 1000 MG Tabs Take 1,000 mg by mouth daily.   lisinopril-hydrochlorothiazide 20-12.5 MG tablet Commonly known as: ZESTORETIC Take 1 tablet by mouth daily.   multivitamin with minerals Tabs tablet Take 1 tablet by mouth daily.   phentermine 37.5 MG tablet Commonly known as: ADIPEX-P Take 37.5 mg by mouth  daily before breakfast.   pravastatin 40 MG tablet Commonly known as: PRAVACHOL Take 40 mg by mouth every evening.   PROBIOTIC PO Take 1 capsule by mouth daily.   pseudoephedrine 120 MG 12 hr tablet Commonly known as: SUDAFED Take 120 mg by mouth daily.   Skyla 13.5 MG Iud Generic drug: Levonorgestrel 1 each by Intrauterine route once.   vitamin C 250 MG tablet Commonly known as: ASCORBIC ACID Take 250 mg by mouth daily.            Durable Medical Equipment  (From admission, onward)         Start     Ordered   01/27/20 1224  DME Walker rolling  Once       Question:  Patient needs a walker to treat with the following condition  Answer:  S/P lumbar fusion   01/27/20 1223   01/27/20 1224  DME 3 n 1  Once        01/27/20 1223          Disposition: home   Final Dx: TLIF L4-5  Discharge Instructions     Remove dressing in  72 hours   Complete by: As directed    Call MD for:  difficulty breathing, headache or visual disturbances   Complete by: As directed    Call MD for:  hives   Complete by: As directed    Call MD for:  persistant dizziness or light-headedness   Complete by: As directed    Call MD for:  persistant nausea and vomiting   Complete by: As directed    Call MD for:  redness, tenderness, or signs of infection (pain, swelling, redness, odor or green/yellow discharge around incision site)   Complete by: As directed    Call MD for:  severe uncontrolled pain   Complete by: As directed    Call MD for:  temperature >100.4   Complete by: As directed    Diet - low sodium heart healthy   Complete by: As directed    Driving Restrictions   Complete by: As directed    No driving for 2 weeks, no riding in the car for 1 week   Increase activity slowly   Complete by: As directed    Lifting restrictions   Complete by: As directed    No lifting more than 8 lbs         Signed: Tiana Loft Brendaliz Kuk 01/27/2020, 5:22 PM

## 2020-01-27 NOTE — Transfer of Care (Signed)
Immediate Anesthesia Transfer of Care Note  Patient: Terri Campbell  Procedure(s) Performed: Transforaminal Lumbar Interbody Fusion - right - Lumbar four-Lumbar five (Right Back)  Patient Location: PACU  Anesthesia Type:General  Level of Consciousness: awake and alert   Airway & Oxygen Therapy: Patient Spontanous Breathing and Patient connected to nasal cannula oxygen  Post-op Assessment: Report given to RN and Post -op Vital signs reviewed and stable  Post vital signs: Reviewed and stable  Last Vitals:  Vitals Value Taken Time  BP    Temp    Pulse 83 01/27/20 1118  Resp 16 01/27/20 1118  SpO2 98 % 01/27/20 1118  Vitals shown include unvalidated device data.  Last Pain:  Vitals:   01/27/20 0703  TempSrc: Temporal         Complications: No complications documented.

## 2020-01-28 ENCOUNTER — Encounter (HOSPITAL_COMMUNITY): Payer: Self-pay | Admitting: Neurological Surgery

## 2020-01-30 ENCOUNTER — Encounter: Payer: Self-pay | Admitting: Dermatology

## 2020-01-30 NOTE — Progress Notes (Signed)
   Follow-Up Visit   Subjective  Terri Campbell is a 54 y.o. female who presents for the following: Follow-up (pt stated--cycst mid breast toward the left/skin tags on the neck.Denied pain).  Skin cyst Location: Right breast Duration: 6+ months Quality: Stable Associated Signs/Symptoms: Modifying Factors:  Severity:  Timing: Context:   Objective  Well appearing patient in no apparent distress; mood and affect are within normal limits.  A focused examination was performed including Head, neck, back, chest, arms.. Relevant physical exam findings are noted in the Assessment and Plan.   Assessment & Plan    Epidermoid cyst Right Breast  Risk of leaving the nodule (growth or inflammation) and of surgery (scar including keloid, infection, recurrence) were detailed.  All the skin cysts are not related to cystic breast disease, she should have her regular mammograms.  She will think this over and schedule I hour if she decides on surgery.  Cutaneous skin tags (2) Neck - Anterior  Scissor snipping at patient's request (no procedure charge).     I, Janalyn Harder, MD, have reviewed all documentation for this visit.  The documentation on 01/30/20 for the exam, diagnosis, procedures, and orders are all accurate and complete.

## 2020-02-29 ENCOUNTER — Other Ambulatory Visit: Payer: Self-pay | Admitting: Family Medicine

## 2020-02-29 DIAGNOSIS — Z1231 Encounter for screening mammogram for malignant neoplasm of breast: Secondary | ICD-10-CM

## 2020-03-08 ENCOUNTER — Other Ambulatory Visit: Payer: Self-pay

## 2020-03-08 ENCOUNTER — Encounter: Payer: Self-pay | Admitting: Dermatology

## 2020-03-08 ENCOUNTER — Ambulatory Visit (INDEPENDENT_AMBULATORY_CARE_PROVIDER_SITE_OTHER): Payer: 59 | Admitting: Dermatology

## 2020-03-08 DIAGNOSIS — L720 Epidermal cyst: Secondary | ICD-10-CM

## 2020-03-08 DIAGNOSIS — Z1283 Encounter for screening for malignant neoplasm of skin: Secondary | ICD-10-CM

## 2020-04-07 ENCOUNTER — Ambulatory Visit: Payer: 59

## 2020-04-11 ENCOUNTER — Ambulatory Visit: Payer: 59

## 2020-04-18 ENCOUNTER — Other Ambulatory Visit: Payer: Self-pay

## 2020-04-18 ENCOUNTER — Ambulatory Visit
Admission: RE | Admit: 2020-04-18 | Discharge: 2020-04-18 | Disposition: A | Discharge status: Home / Self Care | Payer: 59 | Source: Ambulatory Visit | Attending: Family Medicine | Admitting: Family Medicine

## 2020-04-18 DIAGNOSIS — Z1231 Encounter for screening mammogram for malignant neoplasm of breast: Secondary | ICD-10-CM

## 2020-06-15 ENCOUNTER — Encounter: Payer: Self-pay | Admitting: Dermatology

## 2020-06-15 NOTE — Progress Notes (Signed)
   Follow-Up Visit   Subjective  Terri Campbell is a 55 y.o. female who presents for the following: Skin Problem (? cyst on back).  Cyst Location: Back Duration:  Quality:  Associated Signs/Symptoms: Modifying Factors: History of previous inflammation Severity:  Timing: Context:   Objective  Well appearing patient in no apparent distress; mood and affect are within normal limits. Objective  Mid Back: Fibrotic but nonfluctuant 1.5cm deep dermal nodule  Objective  Mid Back: No atypical moles, melanoma, or nonmole skin cancer on back in sun exposed areas.   All sun exposed areas plus back examined.   Assessment & Plan    Epidermal cyst Mid Back  We spent time discussing benefits and risks of leaving this lesion versus scheduling surgery.  She will think this over and let me know if she desires removal.  Skin exam for malignant neoplasm Mid Back  Encouraged to self examine her skin twice annually.  Continued ultraviolet protection.     I, Janalyn Harder, MD, have reviewed all documentation for this visit.  The documentation on 06/15/20 for the exam, diagnosis, procedures, and orders are all accurate and complete.

## 2020-11-21 ENCOUNTER — Other Ambulatory Visit: Payer: Self-pay

## 2020-11-21 ENCOUNTER — Encounter: Payer: Self-pay | Admitting: Physician Assistant

## 2020-11-21 ENCOUNTER — Ambulatory Visit (INDEPENDENT_AMBULATORY_CARE_PROVIDER_SITE_OTHER): Payer: 59 | Admitting: Physician Assistant

## 2020-11-21 DIAGNOSIS — L729 Follicular cyst of the skin and subcutaneous tissue, unspecified: Secondary | ICD-10-CM | POA: Diagnosis not present

## 2020-11-21 DIAGNOSIS — L089 Local infection of the skin and subcutaneous tissue, unspecified: Secondary | ICD-10-CM

## 2020-11-21 MED ORDER — MUPIROCIN 2 % EX OINT: 1.0000 "application " | TOPICAL_OINTMENT | Freq: Every day | CUTANEOUS | 0 refills | Status: AC

## 2020-11-21 MED ORDER — TRIAMCINOLONE ACETONIDE 10 MG/ML IJ SUSP
10.0000 mg | Freq: Once | INTRAMUSCULAR | Status: AC
  Administered 2020-11-21: 10 mg

## 2020-11-21 NOTE — Progress Notes (Signed)
   Follow-Up Visit   Subjective  Terri Campbell is a 55 y.o. female who presents for the following: Cyst (Cyst red inflamed x days not drainage currently ). Painful and has a rash surrounding it. Pt. Did not apply warm compresses. It is very irritated.   The following portions of the chart were reviewed this encounter and updated as appropriate:  Tobacco  Allergies  Meds  Problems  Med Hx  Surg Hx  Fam Hx       Objective  Well appearing patient in no apparent distress; mood and affect are within normal limits.  A focused examination was performed including face and chest. Relevant physical exam findings are noted in the Assessment and Plan.  right chest Red nodule with surrounding erytherma  Assessment & Plan  Cyst of skin right chest  Incision and Drainage - right chest Location: Right Breast  Informed Consent: Discussed risks (permanent scarring, light or dark discoloration, infection, pain, bleeding, bruising, redness, damage to adjacent structures, and recurrence of the lesion) and benefits of the procedure, as well as the alternatives.  Informed consent was obtained.  Preparation: The area was prepped with alcohol.  Anesthesia: Lidocaine 2% with epinephrine  Procedure Details: An incision was made overlying the lesion. The lesion drained pus, clear, mucoid fluid, blood and keratin material.  Ointment and a pressure dressing were applied. The patient tolerated procedure well.  Total number of lesions drained: 1  Plan: The patient was instructed on post-op care. Recommend OTC analgesia as needed for pain.   Anaerobic and Aerobic Culture - right chest  Intralesional injection - right chest Location: Right Breast  Informed Consent: Discussed risks (infection, pain, bleeding, bruising, thinning of the skin, loss of skin pigment,  Indentation, lack of resolution, and recurrence of lesion) and benefits of the procedure, as well as the alternatives. Informed consent  was obtained. Preparation: The area was prepared in a standard fashion.   Procedure Details: An intralesional injection was performed with Kenalog 10 mg/cc. 1.0 cc in total were injected.  Total number of injections: 2  Plan: The patient was instructed on post-op care. Recommend OTC analgesia as needed for pain.   mupirocin ointment (BACTROBAN) 2 % - right chest Apply 1 application topically daily.  Related Medications triamcinolone acetonide (KENALOG) 10 MG/ML injection 10 mg   Local infection of skin and subcutaneous tissue  Related Procedures Anaerobic and Aerobic Culture  Related Medications mupirocin ointment (BACTROBAN) 2 % Apply 1 application topically daily.    I, Arti Trang, PA-C, have reviewed all documentation's for this visit.  The documentation on 11/21/20 for the exam, diagnosis, procedures and orders are all accurate and complete.

## 2020-11-24 ENCOUNTER — Telehealth: Payer: Self-pay | Admitting: Dermatology

## 2020-11-24 NOTE — Telephone Encounter (Signed)
Phone call from patient stating she's still having some drainage from the excision site where cyst was removed.Patient wants to know what to do to help stop the drainage.

## 2020-11-24 NOTE — Telephone Encounter (Signed)
Normal drainage that's why Terri Campbell cut the lesion and injected the kenalog, patient is aware

## 2020-11-27 LAB — ANAEROBIC AND AEROBIC CULTURE
MICRO NUMBER:: 12053572
MICRO NUMBER:: 12053573
SPECIMEN QUALITY:: ADEQUATE
SPECIMEN QUALITY:: ADEQUATE

## 2021-01-12 ENCOUNTER — Encounter: Payer: Self-pay | Admitting: Dermatology

## 2021-01-12 ENCOUNTER — Ambulatory Visit (INDEPENDENT_AMBULATORY_CARE_PROVIDER_SITE_OTHER): Payer: 59 | Admitting: Dermatology

## 2021-01-12 ENCOUNTER — Other Ambulatory Visit: Payer: Self-pay

## 2021-01-12 DIAGNOSIS — L729 Follicular cyst of the skin and subcutaneous tissue, unspecified: Secondary | ICD-10-CM | POA: Diagnosis not present

## 2021-01-23 ENCOUNTER — Encounter: Payer: Self-pay | Admitting: Dermatology

## 2021-01-23 NOTE — Progress Notes (Signed)
   Follow-Up Visit   Subjective  Terri Campbell is a 55 y.o. female who presents for the following: Procedure (Here for cyst removal on right breast).  Cyst right upper breast, may have gone away. Location:  Duration:  Quality:  Associated Signs/Symptoms: Modifying Factors:  Severity:  Timing: Context:   Objective  Well appearing patient in no apparent distress; mood and affect are within normal limits. Right Breast Patient and I agree that there is no visible or palpable residual cyst.  She understands that there is still a possibility of recurrence but surgery is clearly not indicated today.    A focused examination was performed including chest. Relevant physical exam findings are noted in the Assessment and Plan.   Assessment & Plan    Cyst of skin Right Breast  No procedure done today, will continue to observe.   Related Medications mupirocin ointment (BACTROBAN) 2 % Apply 1 application topically daily.      I, Janalyn Harder, MD, have reviewed all documentation for this visit.  The documentation on 01/23/21 for the exam, diagnosis, procedures, and orders are all accurate and complete.

## 2021-03-27 ENCOUNTER — Other Ambulatory Visit: Payer: Self-pay | Admitting: Family Medicine

## 2021-03-27 DIAGNOSIS — Z1231 Encounter for screening mammogram for malignant neoplasm of breast: Secondary | ICD-10-CM

## 2021-05-01 ENCOUNTER — Ambulatory Visit
Admission: RE | Admit: 2021-05-01 | Discharge: 2021-05-01 | Disposition: A | Discharge status: Home / Self Care | Payer: 59 | Source: Ambulatory Visit | Attending: Family Medicine | Admitting: Family Medicine

## 2021-05-01 DIAGNOSIS — Z1231 Encounter for screening mammogram for malignant neoplasm of breast: Secondary | ICD-10-CM

## 2021-05-26 IMAGING — CT CT L SPINE W/ CM
1 of 6 series · 6 of 14 positions shown, 8 images · non-contrast
Comparison: MRI lumbar spine dated January 13, 2019.

CLINICAL DATA: Left foot drop. Bilateral hip peaking when standing
or walking for long periods of time. No prior lumbar surgery.
TECHNIQUE: Contiguous axial images were obtained through the lumbar spine after
the intrathecal infusion of contrast. Coronal and sagittal
reconstructions were obtained of the axial image sets.

[Series 3: l spine soft · axial · 0.36mm/px · z∈[-278,-112]mm · 6 of 77 slices shown, 8 images]
[im 11/77  soft-tissue]
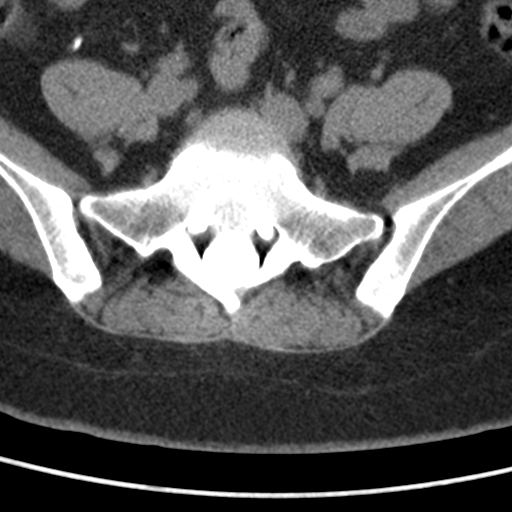
[im 11/77  bone]
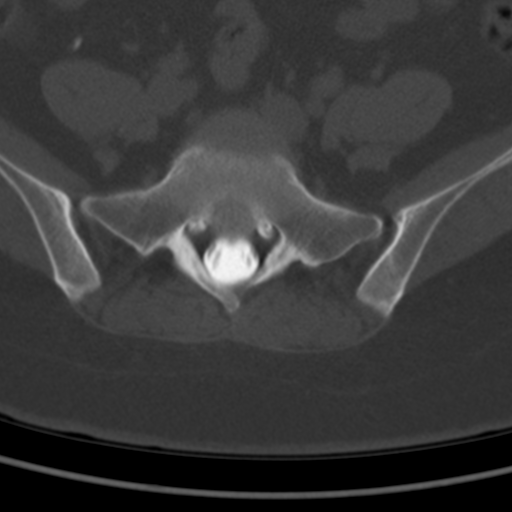
[im 22/77  bone]
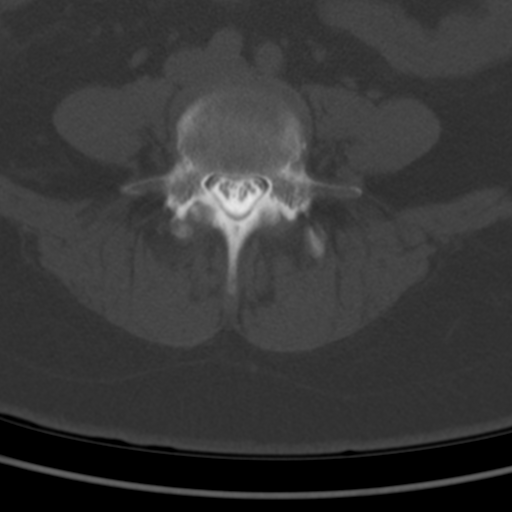
[im 33/77  bone]
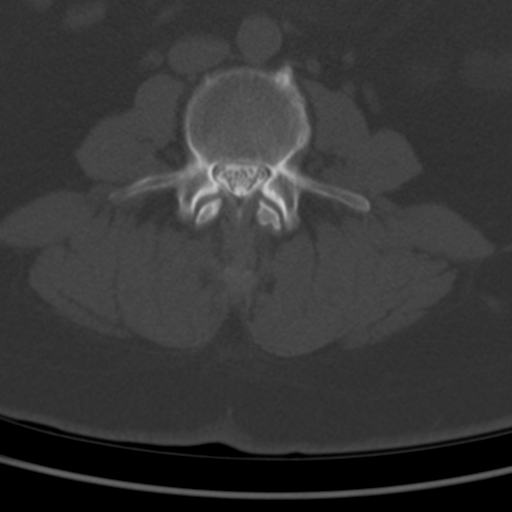
[im 44/77  bone]
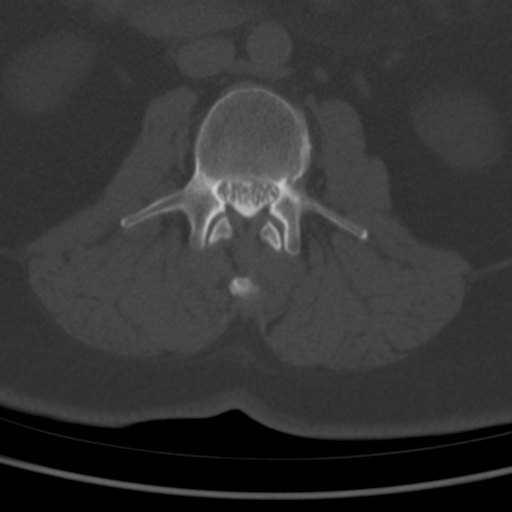
[im 55/77  soft-tissue]
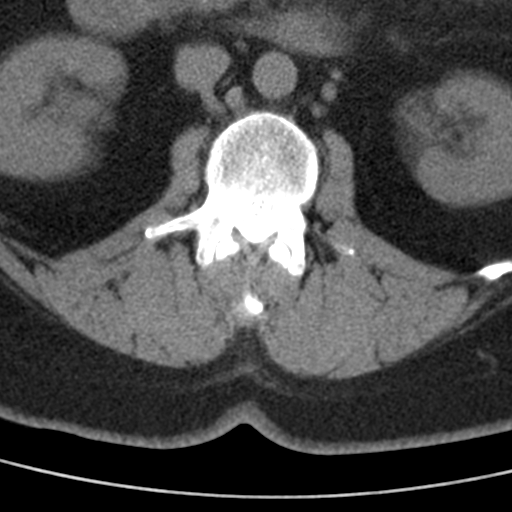
[im 55/77  bone]
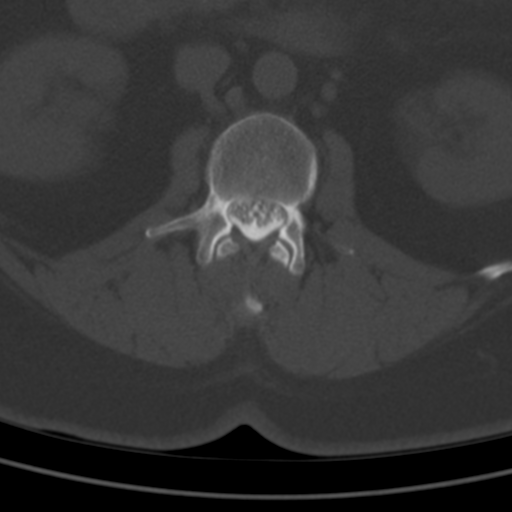
[im 66/77  bone]
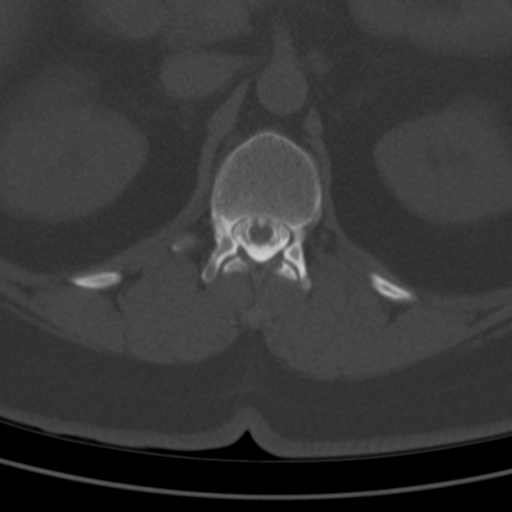

[6 of 14 positions shown; findings below may reference images not displayed]

EXAM:
LUMBAR MYELOGRAM

CT LUMBAR MYELOGRAM

FLUOROSCOPY TIME:  Radiation Exposure Index (as provided by the
fluoroscopic device): 13.9 mGy

Fluoroscopy Time:  25 seconds

Number of Acquired Images:  15

PROCEDURE:
After thorough discussion of risks and benefits of the procedure
including bleeding, infection, injury to nerves, blood vessels,
adjacent structures as well as headache and CSF leak, written and
oral informed consent was obtained. Consent was obtained by Dr.
Piva Lela. Time out form was completed.

Patient was positioned prone on the fluoroscopy table. Local
anesthesia was provided with 1% lidocaine without epinephrine after
prepped and draped in the usual sterile fashion. Puncture was
performed at L3-L4 using a 5 inch 22-gauge spinal needle via right
paramedian approach. Using a single pass through the dura, the
needle was placed within the thecal sac, with return of clear CSF.
15 mL of Isovue [id number] was injected into the thecal sac, with normal
opacification of the nerve roots and cauda equina consistent with
free flow within the subarachnoid space.

I personally performed the lumbar puncture and administered the
intrathecal contrast. I also personally supervised acquisition of
the myelogram images.
FINDINGS: LUMBAR MYELOGRAM FINDINGS:

2 mm anterolisthesis at L4-L5 increases to 4 mm with standing and
flexion. 2 mm anterolisthesis at L5-S1 increases to 7 mm with
standing and flexion, and 6 mm with extension.

Small ventral extradural defects at T12-L1 and from L2-L3 through
L5-S1, most prominent at L4-L5 and L5-S1. Slight Underfilling of the
right L5 nerve root compared to the left.

CT LUMBAR MYELOGRAM FINDINGS:

Segmentation: Standard.

Alignment: 2 mm anterolisthesis at L4-L5 and L5-S1.

Vertebrae: No acute fracture or other focal pathologic process.

Conus medullaris and cauda equina: Conus extends to the L2 level.
Conus and cauda equina appear normal.

Paraspinal and other soft tissues: Mild bilateral sacroiliac joint
osteoarthritis. Otherwise negative.

Disc levels:

T12-L1:  Unchanged mild disc bulging.  No stenosis.

L1-L2:  Negative.

L2-L3:  Unchanged mild disc bulging.  No stenosis.

L3-L4: Unchanged mild disc bulging and mild spinal canal stenosis.
No neuroforaminal stenosis.

L4-L5: Unchanged mild disc bulging with superimposed small left
foraminal disc protrusion. Unchanged moderate to severe bilateral
facet arthropathy. Unchanged mild-to-moderate spinal canal stenosis
and mild left lateral recess and neuroforaminal stenosis. No right
neuroforaminal stenosis.

L5-S1: Unchanged mild disc bulging eccentric to the left and severe
bilateral facet arthropathy. The lesion in the anterior left neural
foramen seen on MRI is again noted (series 8, image 28). Unchanged
mild to moderate left and mild right neuroforaminal stenosis. No
spinal canal stenosis.
IMPRESSION: 1. Facet mediated instability at L4-L5 and L5-S1. 2 mm
anterolisthesis at L5-S1 increases to 7 mm with standing and
flexion, likely exacerbating the mild-to-moderate left and mild
right neuroforaminal stenosis seen when supine.
2. Unchanged lesion in the anterior aspect of the left L5-S1 neural
foramen potentially representing a disc fragment or venous varix.
3. Unchanged mild-to-moderate spinal canal stenosis and mild left
lateral recess and neuroforaminal stenosis at L4-L5 when supine.

## 2022-05-01 ENCOUNTER — Other Ambulatory Visit: Payer: Self-pay | Admitting: Family Medicine

## 2022-05-01 DIAGNOSIS — Z1231 Encounter for screening mammogram for malignant neoplasm of breast: Secondary | ICD-10-CM

## 2022-06-26 ENCOUNTER — Ambulatory Visit
Admission: RE | Admit: 2022-06-26 | Discharge: 2022-06-26 | Disposition: A | Discharge status: Home / Self Care | Payer: 59 | Source: Ambulatory Visit | Attending: Family Medicine | Admitting: Family Medicine

## 2022-06-26 DIAGNOSIS — Z1231 Encounter for screening mammogram for malignant neoplasm of breast: Secondary | ICD-10-CM | POA: Diagnosis not present

## 2022-08-21 DIAGNOSIS — M545 Low back pain, unspecified: Secondary | ICD-10-CM | POA: Diagnosis not present

## 2022-08-21 DIAGNOSIS — M25551 Pain in right hip: Secondary | ICD-10-CM | POA: Diagnosis not present

## 2023-02-25 DIAGNOSIS — Z8349 Family history of other endocrine, nutritional and metabolic diseases: Secondary | ICD-10-CM | POA: Diagnosis not present

## 2023-02-27 DIAGNOSIS — Z0000 Encounter for general adult medical examination without abnormal findings: Secondary | ICD-10-CM | POA: Diagnosis not present

## 2023-02-27 DIAGNOSIS — E7800 Pure hypercholesterolemia, unspecified: Secondary | ICD-10-CM | POA: Diagnosis not present

## 2023-02-27 DIAGNOSIS — M109 Gout, unspecified: Secondary | ICD-10-CM | POA: Diagnosis not present

## 2023-02-27 DIAGNOSIS — J309 Allergic rhinitis, unspecified: Secondary | ICD-10-CM | POA: Diagnosis not present

## 2023-02-27 DIAGNOSIS — R7303 Prediabetes: Secondary | ICD-10-CM | POA: Diagnosis not present

## 2023-02-27 DIAGNOSIS — I10 Essential (primary) hypertension: Secondary | ICD-10-CM | POA: Diagnosis not present

## 2023-02-27 DIAGNOSIS — R799 Abnormal finding of blood chemistry, unspecified: Secondary | ICD-10-CM | POA: Diagnosis not present

## 2023-02-27 DIAGNOSIS — Z8349 Family history of other endocrine, nutritional and metabolic diseases: Secondary | ICD-10-CM | POA: Diagnosis not present

## 2023-03-08 DIAGNOSIS — Z23 Encounter for immunization: Secondary | ICD-10-CM | POA: Diagnosis not present

## 2023-06-03 DIAGNOSIS — L0291 Cutaneous abscess, unspecified: Secondary | ICD-10-CM | POA: Diagnosis not present

## 2023-06-03 DIAGNOSIS — L02213 Cutaneous abscess of chest wall: Secondary | ICD-10-CM | POA: Diagnosis not present

## 2023-06-03 DIAGNOSIS — M25552 Pain in left hip: Secondary | ICD-10-CM | POA: Diagnosis not present

## 2023-06-03 DIAGNOSIS — Z6838 Body mass index (BMI) 38.0-38.9, adult: Secondary | ICD-10-CM | POA: Diagnosis not present

## 2023-06-10 DIAGNOSIS — L02213 Cutaneous abscess of chest wall: Secondary | ICD-10-CM | POA: Diagnosis not present

## 2023-06-10 DIAGNOSIS — Z6838 Body mass index (BMI) 38.0-38.9, adult: Secondary | ICD-10-CM | POA: Diagnosis not present

## 2023-06-17 DIAGNOSIS — Z789 Other specified health status: Secondary | ICD-10-CM | POA: Diagnosis not present

## 2023-06-17 DIAGNOSIS — L720 Epidermal cyst: Secondary | ICD-10-CM | POA: Diagnosis not present

## 2023-07-01 DIAGNOSIS — L728 Other follicular cysts of the skin and subcutaneous tissue: Secondary | ICD-10-CM | POA: Diagnosis not present

## 2023-07-01 DIAGNOSIS — D225 Melanocytic nevi of trunk: Secondary | ICD-10-CM | POA: Diagnosis not present

## 2023-07-01 DIAGNOSIS — L858 Other specified epidermal thickening: Secondary | ICD-10-CM | POA: Diagnosis not present

## 2023-07-01 DIAGNOSIS — L821 Other seborrheic keratosis: Secondary | ICD-10-CM | POA: Diagnosis not present

## 2023-07-01 DIAGNOSIS — L814 Other melanin hyperpigmentation: Secondary | ICD-10-CM | POA: Diagnosis not present

## 2023-07-08 DIAGNOSIS — L728 Other follicular cysts of the skin and subcutaneous tissue: Secondary | ICD-10-CM | POA: Diagnosis not present

## 2023-07-08 DIAGNOSIS — L308 Other specified dermatitis: Secondary | ICD-10-CM | POA: Diagnosis not present

## 2023-07-08 DIAGNOSIS — R208 Other disturbances of skin sensation: Secondary | ICD-10-CM | POA: Diagnosis not present

## 2023-07-08 DIAGNOSIS — L538 Other specified erythematous conditions: Secondary | ICD-10-CM | POA: Diagnosis not present

## 2023-09-12 DIAGNOSIS — M7060 Trochanteric bursitis, unspecified hip: Secondary | ICD-10-CM | POA: Diagnosis not present

## 2023-09-12 DIAGNOSIS — Z6838 Body mass index (BMI) 38.0-38.9, adult: Secondary | ICD-10-CM | POA: Diagnosis not present

## 2023-09-12 DIAGNOSIS — M109 Gout, unspecified: Secondary | ICD-10-CM | POA: Diagnosis not present

## 2023-09-18 ENCOUNTER — Other Ambulatory Visit: Payer: Self-pay | Admitting: Family Medicine

## 2023-09-18 DIAGNOSIS — Z1231 Encounter for screening mammogram for malignant neoplasm of breast: Secondary | ICD-10-CM

## 2023-10-03 ENCOUNTER — Ambulatory Visit
Admission: RE | Admit: 2023-10-03 | Discharge: 2023-10-03 | Disposition: A | Discharge status: Home / Self Care | Source: Ambulatory Visit | Attending: Family Medicine | Admitting: Family Medicine

## 2023-10-03 DIAGNOSIS — Z1231 Encounter for screening mammogram for malignant neoplasm of breast: Secondary | ICD-10-CM | POA: Diagnosis not present

## 2023-10-03 DIAGNOSIS — M25551 Pain in right hip: Secondary | ICD-10-CM | POA: Diagnosis not present

## 2023-10-03 DIAGNOSIS — M25552 Pain in left hip: Secondary | ICD-10-CM | POA: Diagnosis not present

## 2023-10-04 DIAGNOSIS — Z1159 Encounter for screening for other viral diseases: Secondary | ICD-10-CM | POA: Diagnosis not present

## 2023-10-08 DIAGNOSIS — M25552 Pain in left hip: Secondary | ICD-10-CM | POA: Diagnosis not present

## 2023-10-08 DIAGNOSIS — M25551 Pain in right hip: Secondary | ICD-10-CM | POA: Diagnosis not present

## 2023-10-25 DIAGNOSIS — M25551 Pain in right hip: Secondary | ICD-10-CM | POA: Diagnosis not present

## 2023-10-25 DIAGNOSIS — M25552 Pain in left hip: Secondary | ICD-10-CM | POA: Diagnosis not present

## 2023-11-01 DIAGNOSIS — M25551 Pain in right hip: Secondary | ICD-10-CM | POA: Diagnosis not present

## 2023-11-01 DIAGNOSIS — M25552 Pain in left hip: Secondary | ICD-10-CM | POA: Diagnosis not present

## 2023-11-08 DIAGNOSIS — M25552 Pain in left hip: Secondary | ICD-10-CM | POA: Diagnosis not present

## 2023-11-08 DIAGNOSIS — M25551 Pain in right hip: Secondary | ICD-10-CM | POA: Diagnosis not present

## 2023-11-15 DIAGNOSIS — M25551 Pain in right hip: Secondary | ICD-10-CM | POA: Diagnosis not present

## 2023-11-15 DIAGNOSIS — M25552 Pain in left hip: Secondary | ICD-10-CM | POA: Diagnosis not present

## 2023-11-22 DIAGNOSIS — M25551 Pain in right hip: Secondary | ICD-10-CM | POA: Diagnosis not present

## 2023-11-22 DIAGNOSIS — M25552 Pain in left hip: Secondary | ICD-10-CM | POA: Diagnosis not present

## 2024-02-12 DIAGNOSIS — R059 Cough, unspecified: Secondary | ICD-10-CM | POA: Diagnosis not present

## 2024-02-12 DIAGNOSIS — M25559 Pain in unspecified hip: Secondary | ICD-10-CM | POA: Diagnosis not present

## 2024-02-12 DIAGNOSIS — Z202 Contact with and (suspected) exposure to infections with a predominantly sexual mode of transmission: Secondary | ICD-10-CM | POA: Diagnosis not present

## 2024-02-12 DIAGNOSIS — Z6839 Body mass index (BMI) 39.0-39.9, adult: Secondary | ICD-10-CM | POA: Diagnosis not present

## 2024-02-12 DIAGNOSIS — R232 Flushing: Secondary | ICD-10-CM | POA: Diagnosis not present

## 2024-02-20 DIAGNOSIS — R232 Flushing: Secondary | ICD-10-CM | POA: Diagnosis not present

## 2024-03-05 DIAGNOSIS — Z6839 Body mass index (BMI) 39.0-39.9, adult: Secondary | ICD-10-CM | POA: Diagnosis not present

## 2024-03-05 DIAGNOSIS — R799 Abnormal finding of blood chemistry, unspecified: Secondary | ICD-10-CM | POA: Diagnosis not present

## 2024-03-05 DIAGNOSIS — L74519 Primary focal hyperhidrosis, unspecified: Secondary | ICD-10-CM | POA: Diagnosis not present

## 2024-03-05 DIAGNOSIS — I10 Essential (primary) hypertension: Secondary | ICD-10-CM | POA: Diagnosis not present

## 2024-03-05 DIAGNOSIS — Z0000 Encounter for general adult medical examination without abnormal findings: Secondary | ICD-10-CM | POA: Diagnosis not present

## 2024-03-05 DIAGNOSIS — M545 Low back pain, unspecified: Secondary | ICD-10-CM | POA: Diagnosis not present

## 2024-03-05 DIAGNOSIS — E7800 Pure hypercholesterolemia, unspecified: Secondary | ICD-10-CM | POA: Diagnosis not present

## 2024-03-05 DIAGNOSIS — R7303 Prediabetes: Secondary | ICD-10-CM | POA: Diagnosis not present

## 2024-03-05 DIAGNOSIS — M109 Gout, unspecified: Secondary | ICD-10-CM | POA: Diagnosis not present

## 2024-03-05 DIAGNOSIS — R252 Cramp and spasm: Secondary | ICD-10-CM | POA: Diagnosis not present

## 2024-03-05 DIAGNOSIS — Z124 Encounter for screening for malignant neoplasm of cervix: Secondary | ICD-10-CM | POA: Diagnosis not present

## 2024-03-05 DIAGNOSIS — B009 Herpesviral infection, unspecified: Secondary | ICD-10-CM | POA: Diagnosis not present

## 2024-03-17 DIAGNOSIS — M25551 Pain in right hip: Secondary | ICD-10-CM | POA: Diagnosis not present

## 2024-03-17 DIAGNOSIS — M25552 Pain in left hip: Secondary | ICD-10-CM | POA: Diagnosis not present

## 2024-03-20 ENCOUNTER — Other Ambulatory Visit: Payer: Self-pay

## 2024-03-20 DIAGNOSIS — M25551 Pain in right hip: Secondary | ICD-10-CM

## 2024-03-26 ENCOUNTER — Encounter: Payer: Self-pay | Admitting: Neurological Surgery

## 2024-04-01 ENCOUNTER — Ambulatory Visit
Admission: RE | Admit: 2024-04-01 | Discharge: 2024-04-01 | Disposition: A | Discharge status: Home / Self Care | Source: Ambulatory Visit

## 2024-04-01 DIAGNOSIS — R600 Localized edema: Secondary | ICD-10-CM | POA: Diagnosis not present

## 2024-04-01 DIAGNOSIS — M25552 Pain in left hip: Secondary | ICD-10-CM

## 2024-04-06 DIAGNOSIS — M25559 Pain in unspecified hip: Secondary | ICD-10-CM | POA: Diagnosis not present

## 2024-04-06 DIAGNOSIS — R7303 Prediabetes: Secondary | ICD-10-CM | POA: Diagnosis not present

## 2024-04-06 DIAGNOSIS — I10 Essential (primary) hypertension: Secondary | ICD-10-CM | POA: Diagnosis not present

## 2024-04-06 DIAGNOSIS — J309 Allergic rhinitis, unspecified: Secondary | ICD-10-CM | POA: Diagnosis not present

## 2024-04-06 DIAGNOSIS — M109 Gout, unspecified: Secondary | ICD-10-CM | POA: Diagnosis not present

## 2024-04-06 DIAGNOSIS — B079 Viral wart, unspecified: Secondary | ICD-10-CM | POA: Diagnosis not present

## 2024-04-14 DIAGNOSIS — M25551 Pain in right hip: Secondary | ICD-10-CM | POA: Diagnosis not present

## 2024-04-14 DIAGNOSIS — M25559 Pain in unspecified hip: Secondary | ICD-10-CM | POA: Diagnosis not present

## 2024-04-14 DIAGNOSIS — M1612 Unilateral primary osteoarthritis, left hip: Secondary | ICD-10-CM | POA: Diagnosis not present

## 2024-04-14 DIAGNOSIS — M25552 Pain in left hip: Secondary | ICD-10-CM | POA: Diagnosis not present

## 2024-05-07 DIAGNOSIS — Z6839 Body mass index (BMI) 39.0-39.9, adult: Secondary | ICD-10-CM | POA: Diagnosis not present

## 2024-05-07 DIAGNOSIS — B079 Viral wart, unspecified: Secondary | ICD-10-CM | POA: Diagnosis not present

## 2024-05-19 DIAGNOSIS — M25551 Pain in right hip: Secondary | ICD-10-CM | POA: Diagnosis not present

## 2024-05-19 DIAGNOSIS — M1612 Unilateral primary osteoarthritis, left hip: Secondary | ICD-10-CM | POA: Diagnosis not present

## 2024-05-19 DIAGNOSIS — M25552 Pain in left hip: Secondary | ICD-10-CM | POA: Diagnosis not present

## 2024-05-19 DIAGNOSIS — M25559 Pain in unspecified hip: Secondary | ICD-10-CM | POA: Diagnosis not present
